# Patient Record
Sex: Female | Born: 1975 | Race: Black or African American | Hispanic: No | Marital: Single | Smoking: Former smoker
Health system: Southern US, Community
[De-identification: ages and names within clinical notes are randomized; demographics above are authoritative.]

## PROBLEM LIST (undated history)

## (undated) DIAGNOSIS — T7840XA Allergy, unspecified, initial encounter: Secondary | ICD-10-CM

## (undated) DIAGNOSIS — F419 Anxiety disorder, unspecified: Secondary | ICD-10-CM

## (undated) DIAGNOSIS — D649 Anemia, unspecified: Secondary | ICD-10-CM

## (undated) DIAGNOSIS — Z5189 Encounter for other specified aftercare: Secondary | ICD-10-CM

## (undated) HISTORY — PX: TUBAL LIGATION: SHX77

## (undated) HISTORY — DX: Anemia, unspecified: D64.9

## (undated) HISTORY — DX: Encounter for other specified aftercare: Z51.89

## (undated) HISTORY — DX: Anxiety disorder, unspecified: F41.9

## (undated) HISTORY — DX: Allergy, unspecified, initial encounter: T78.40XA

---

## 2000-07-13 HISTORY — PX: TUBAL LIGATION: SHX77

## 2010-07-10 ENCOUNTER — Telehealth (INDEPENDENT_AMBULATORY_CARE_PROVIDER_SITE_OTHER): Payer: Self-pay | Admitting: *Deleted

## 2010-07-10 ENCOUNTER — Encounter: Payer: Self-pay | Admitting: Cardiology

## 2010-07-16 DIAGNOSIS — K219 Gastro-esophageal reflux disease without esophagitis: Secondary | ICD-10-CM | POA: Insufficient documentation

## 2010-07-16 DIAGNOSIS — R079 Chest pain, unspecified: Secondary | ICD-10-CM | POA: Insufficient documentation

## 2010-07-16 DIAGNOSIS — R9431 Abnormal electrocardiogram [ECG] [EKG]: Secondary | ICD-10-CM | POA: Insufficient documentation

## 2010-07-17 ENCOUNTER — Other Ambulatory Visit: Payer: Self-pay | Admitting: Cardiology

## 2010-07-17 ENCOUNTER — Ambulatory Visit
Admission: RE | Admit: 2010-07-17 | Discharge: 2010-07-17 | Payer: Self-pay | Source: Home / Self Care | Attending: Cardiology | Admitting: Cardiology

## 2010-07-17 DIAGNOSIS — R002 Palpitations: Secondary | ICD-10-CM | POA: Insufficient documentation

## 2010-07-17 LAB — TSH: TSH: 2.07 u[IU]/mL (ref 0.35–5.50)

## 2010-07-21 ENCOUNTER — Ambulatory Visit: Admission: RE | Admit: 2010-07-21 | Discharge: 2010-07-21 | Payer: Self-pay | Source: Home / Self Care

## 2010-07-21 ENCOUNTER — Ambulatory Visit (HOSPITAL_COMMUNITY)
Admission: RE | Admit: 2010-07-21 | Discharge: 2010-07-21 | Payer: Self-pay | Source: Home / Self Care | Attending: Cardiology | Admitting: Cardiology

## 2010-07-21 ENCOUNTER — Encounter: Payer: Self-pay | Admitting: Cardiology

## 2010-07-21 DIAGNOSIS — R002 Palpitations: Secondary | ICD-10-CM

## 2010-08-14 NOTE — Progress Notes (Signed)
  LOV received from [facility] gave to Lela for pt appt Jan 5th w/ Teresa Pelton Mesiemore  July 10, 2010 11:40 AM

## 2010-08-14 NOTE — Progress Notes (Signed)
Summary: [facility] Office Visit Note   [facility] Office Visit Note   Imported By: Roderic Ovens 08/08/2010 16:11:59  _____________________________________________________________________  External Attachment:    Type:   Image     Comment:   External Document

## 2010-08-14 NOTE — Assessment & Plan Note (Signed)
Summary: np6/dx:abnormal ekg/lg   CC:  referal from Dr. Roseanne Reno abnormal ekg.  History of Present Illness: 35 year old female with no prior cardiac history or evaluation of chest pain and abnormal electrocardiogram. No prior cardiac history. The patient states that for the past month she has had intermittent palpitations. They are sudden in onset and described as racing. There is no associated shortness of breath or syncope. There is chest heaviness. These typically resolve spontaneously gradually over time. She also states she will have chest squeezing at other times other than when she has her palpitations. This can last up to 30 minutes at a time. It is not pleuritic, positional, exertional or related to food. There is no associated symptoms. It improves with changing her position and sitting up straight. Because of the above we are asked to further evaluate.  Preventive Screening-Counseling & Management  Alcohol-Tobacco     Smoking Status: quit  Current Medications (verified): 1)  Naproxen 500 Mg Tabs (Naproxen) .Marland Kitchen.. 1 Tab By Mouth Once Daily 2)  Multivitamins   Tabs (Multiple Vitamin) .Marland Kitchen.. 1 Tab By Mouth Once Daily 3)  Omeprazole 20 Mg Cpdr (Omeprazole) .Marland Kitchen.. 1  Tab By Mouth Once Daily 4)  Actavis .... As Directed Skin Cream  Past History:  Past Medical History: GERD Hyperlipidemia  Past Surgical History: Tubal ligation  Family History: Reviewed history and no changes required. No premature CAD No sudden death  Social History: Reviewed history and no changes required. Student  Single  Tobacco Use - Former.  Alcohol Use - yes Smoking Status:  quit  Review of Systems       no fevers or chills, productive cough, hemoptysis, dysphasia, odynophagia, melena, hematochezia, dysuria, hematuria, rash, seizure activity, orthopnea, PND, pedal edema, claudication. Remaining systems are negative.   Vital Signs:  Patient profile:   35 year old female Weight:      210  pounds Pulse rate:   78 / minute Resp:     12 per minute BP sitting:   120 / 80  (left arm)  Vitals Entered By: Kem Parkinson (July 17, 2010 12:12 PM)  Physical Exam  General:  Well developed/well nourished in NAD Skin warm/dry Patient not depressed No peripheral clubbing Back-normal HEENT-normal/normal eyelids Neck supple/normal carotid upstroke bilaterally; no bruits; no JVD; no thyromegaly chest - CTA/ normal expansion CV - RRR/normal S1 and S2; no murmurs, rubs or gallops;  PMI nondisplaced Abdomen -NT/ND, no HSM, no mass, + bowel sounds, no bruit 2+ femoral pulses, no bruits Ext-no edema, chords, 2+ DP Neuro-grossly nonfocal     EKG  Procedure date:  07/17/2010  Findings:      Normal sinus rhythm, axis normal. Nonspecific ST changes.  Impression & Recommendations:  Problem # 1:  CHEST PAIN (ICD-786.50) Symptoms atypical and not consistent with cardiac pain. No further ischemia evaluation.  Problem # 2:  PALPITATIONS (ICD-785.1) Etiology unclear. Check echocardiogram, TSH and CardioNet. Orders: Echocardiogram (Echo) Event (Event) TLB-TSH (Thyroid Stimulating Hormone) (84443-TSH)  Problem # 3:  GERD (ICD-530.81) Management per primary care. Her updated medication list for this problem includes:    Omeprazole 20 Mg Cpdr (Omeprazole) .Marland Kitchen... 1  tab by mouth once daily  Patient Instructions: 1)  Your physician recommends that you schedule a follow-up appointment in: 8 WEEKS 2)  Your physician has requested that you have an echocardiogram.  Echocardiography is a painless test that uses sound waves to create images of your heart. It provides your doctor with information about the size and shape  of your heart and how well your heart's chambers and valves are working.  This procedure takes approximately one hour. There are no restrictions for this procedure. 3)  Your physician has recommended that you wear an event monitor.  Event monitors are medical devices that  record the heart's electrical activity. Doctors most often use these monitors to diagnose arrhythmias. Arrhythmias are problems with the speed or rhythm of the heartbeat. The monitor is a small, portable device. You can wear one while you do your normal daily activities. This is usually used to diagnose what is causing palpitations/syncope (passing out).

## 2010-08-27 ENCOUNTER — Telehealth (INDEPENDENT_AMBULATORY_CARE_PROVIDER_SITE_OTHER): Payer: Self-pay | Admitting: *Deleted

## 2010-09-03 NOTE — Progress Notes (Signed)
  Phone Note Outgoing Call   Call placed by: Deliah Goody, RN,  August 27, 2010 3:08 PM Summary of Call: left message for pt that monitor shows NSR Deliah Goody, RN  August 27, 2010 3:09 PM

## 2010-09-08 ENCOUNTER — Ambulatory Visit (INDEPENDENT_AMBULATORY_CARE_PROVIDER_SITE_OTHER): Payer: Medicaid Other | Admitting: Cardiology

## 2010-09-08 ENCOUNTER — Encounter: Payer: Self-pay | Admitting: Cardiology

## 2010-09-08 DIAGNOSIS — R072 Precordial pain: Secondary | ICD-10-CM

## 2010-09-08 DIAGNOSIS — R002 Palpitations: Secondary | ICD-10-CM

## 2010-09-18 NOTE — Assessment & Plan Note (Signed)
Summary: 2 month [email or web address] appt confirm [email or web address]   CC:  follow up.  History of Present Illness: 35 year old female with no prior cardiac history I saw in Jan 2012 for evaluation of chest pain and abnormal electrocardiogram. Echocardiogram in January 2012 showed normal LV function. TSH was normal. Baseline CardioNet transmission showed sinus rhythm. Since I last saw her, the patient denies any dyspnea on exertion, orthopnea, PND, pedal edema, palpitations, syncope or chest pain.   Current Medications (verified): 1)  Naproxen 500 Mg Tabs (Naproxen) .Marland Kitchen.. 1 Tab By Mouth Once Daily 2)  Multivitamins   Tabs (Multiple Vitamin) .Marland Kitchen.. 1 Tab By Mouth Once Daily 3)  Omeprazole 20 Mg Cpdr (Omeprazole) .Marland Kitchen.. 1  Tab By Mouth Once Daily 4)  Actavis .... As Directed Skin Cream 5)  Hydrocodone-Acetaminophen 5-500 Mg Tabs (Hydrocodone-Acetaminophen) .... As Needed  Past History:  Past Medical History: Reviewed history from 07/17/2010 and no changes required. GERD Hyperlipidemia  Past Surgical History: Reviewed history from 07/17/2010 and no changes required. Tubal ligation  Social History: Reviewed history from 07/17/2010 and no changes required. Student  Single  Tobacco Use - Former.  Alcohol Use - yes  Review of Systems       no fevers or chills, productive cough, hemoptysis, dysphasia, odynophagia, melena, hematochezia, dysuria, hematuria, rash, seizure activity, orthopnea, PND, pedal edema, claudication. Remaining systems are negative.   Vital Signs:  Patient profile:   35 year old female Height:      65 inches Weight:      216 pounds BMI:     36.07 Pulse rate:   80 / minute Resp:     12 per minute BP sitting:   130 / 80  (left arm)  Vitals Entered By: Kem Parkinson (September 08, 2010 2:40 PM)  Physical Exam  General:  Well-developed well-nourished in no acute distress.  Skin is warm and dry.  HEENT is normal.  Neck is supple. No thyromegaly.  Chest is clear to  auscultation with normal expansion.  Cardiovascular exam is regular rate and rhythm.  Abdominal exam nontender or distended. No masses palpated. Extremities show no edema. neuro grossly intact    Impression & Recommendations:  Problem # 1:  PALPITATIONS (ICD-785.1) Symptoms resolved, TSH normal, echo normal. Will not pursue further workup at this point.  Problem # 2:  CHEST PAIN (ICD-786.50) Symptoms resolved. She feels it was most likely GI related. No further evaluation at this time.  Problem # 3:  GERD (ICD-530.81)  Her updated medication list for this problem includes:    Omeprazole 20 Mg Cpdr (Omeprazole) .Marland Kitchen... 1  tab by mouth once daily

## 2010-09-18 NOTE — Procedures (Signed)
Summary: Summary Report  Summary Report   Imported By: Erle Crocker 09/12/2010 13:40:16  _____________________________________________________________________  External Attachment:    Type:   Image     Comment:   External Document

## 2011-12-28 ENCOUNTER — Ambulatory Visit (INDEPENDENT_AMBULATORY_CARE_PROVIDER_SITE_OTHER): Payer: Medicaid Other | Admitting: Psychiatry

## 2011-12-28 DIAGNOSIS — F988 Other specified behavioral and emotional disorders with onset usually occurring in childhood and adolescence: Secondary | ICD-10-CM

## 2013-07-07 ENCOUNTER — Encounter (HOSPITAL_COMMUNITY): Payer: Self-pay | Admitting: Emergency Medicine

## 2013-07-07 ENCOUNTER — Emergency Department (HOSPITAL_COMMUNITY)
Admission: EM | Admit: 2013-07-07 | Discharge: 2013-07-07 | Disposition: A | Discharge status: Home / Self Care | Payer: Medicaid Other | Attending: Emergency Medicine | Admitting: Emergency Medicine

## 2013-07-07 DIAGNOSIS — Z3202 Encounter for pregnancy test, result negative: Secondary | ICD-10-CM | POA: Insufficient documentation

## 2013-07-07 DIAGNOSIS — N949 Unspecified condition associated with female genital organs and menstrual cycle: Secondary | ICD-10-CM | POA: Insufficient documentation

## 2013-07-07 DIAGNOSIS — N898 Other specified noninflammatory disorders of vagina: Secondary | ICD-10-CM | POA: Insufficient documentation

## 2013-07-07 DIAGNOSIS — A599 Trichomoniasis, unspecified: Secondary | ICD-10-CM | POA: Insufficient documentation

## 2013-07-07 DIAGNOSIS — R109 Unspecified abdominal pain: Secondary | ICD-10-CM | POA: Insufficient documentation

## 2013-07-07 DIAGNOSIS — Z87891 Personal history of nicotine dependence: Secondary | ICD-10-CM | POA: Insufficient documentation

## 2013-07-07 DIAGNOSIS — R300 Dysuria: Secondary | ICD-10-CM | POA: Insufficient documentation

## 2013-07-07 LAB — URINE MICROSCOPIC-ADD ON

## 2013-07-07 LAB — URINALYSIS, ROUTINE W REFLEX MICROSCOPIC
Bilirubin Urine: NEGATIVE
Glucose, UA: NEGATIVE mg/dL
Hgb urine dipstick: NEGATIVE
Ketones, ur: NEGATIVE mg/dL
Nitrite: NEGATIVE
Protein, ur: NEGATIVE mg/dL
Specific Gravity, Urine: 1.02 (ref 1.005–1.030)
Urobilinogen, UA: 1 mg/dL (ref 0.0–1.0)
pH: 7 (ref 5.0–8.0)

## 2013-07-07 LAB — POCT PREGNANCY, URINE: Preg Test, Ur: NEGATIVE

## 2013-07-07 LAB — WET PREP, GENITAL

## 2013-07-07 MED ORDER — LIDOCAINE HCL (PF) 1 % IJ SOLN
INTRAMUSCULAR | Status: AC
  Administered 2013-07-07: 0.9 mL
  Filled 2013-07-07: qty 5

## 2013-07-07 MED ORDER — METRONIDAZOLE 500 MG PO TABS
2000.0000 mg | ORAL_TABLET | Freq: Once | ORAL | Status: AC
  Administered 2013-07-07: 2000 mg via ORAL
  Filled 2013-07-07: qty 4

## 2013-07-07 MED ORDER — CEFTRIAXONE SODIUM 250 MG IJ SOLR
250.0000 mg | Freq: Once | INTRAMUSCULAR | Status: AC
  Administered 2013-07-07: 250 mg via INTRAMUSCULAR
  Filled 2013-07-07: qty 250

## 2013-07-07 MED ORDER — AZITHROMYCIN 250 MG PO TABS
1000.0000 mg | ORAL_TABLET | Freq: Once | ORAL | Status: AC
  Administered 2013-07-07: 1000 mg via ORAL
  Filled 2013-07-07: qty 4

## 2013-07-07 NOTE — ED Notes (Signed)
Per pt sts 2 weeks of lower abdominal pain with vaginal discharge. sts she thought was a yeast infection but not getting any better.

## 2013-07-07 NOTE — ED Notes (Signed)
OPt states she has been having some vaginal itching and lower abd pain for two weeks.

## 2013-07-07 NOTE — ED Provider Notes (Signed)
CSN: [id number]     Arrival date & time 07/07/13  1515 History   First MD Initiated Contact with Patient 07/07/13 2149     Chief Complaint  Patient presents with  . Abdominal Pain  . Vaginal Discharge   (Consider location/radiation/quality/duration/timing/severity/associated sxs/prior Treatment) Patient is a 37 y.o. female presenting with abdominal pain and vaginal discharge. The history is provided by the patient and medical records.  Abdominal Pain Associated symptoms: dysuria and vaginal discharge   Vaginal Discharge Associated symptoms: abdominal pain and dysuria    This is a 37 year old female status post tubal ligation presenting to the ED for pelvic pain, dysuria, vaginal discharge x 2 weeks.  Pt states pelvic pain is intermittent, described as a sharp, stabbing, sensation that seems to "jump around" her pelvis.  She has had no pain today.  Discharge is constant, white, and without associated odor.  No prior hx of STD.  Pt is in committed relationship with 1 female sexual partner-- states she is concerned he may have had an affair and exposed her to an STD.  Denies any fevers, sweats, or chills.  No nausea, vomiting, or diarrhea.  History reviewed. No pertinent past medical history. Past Surgical History  Procedure Laterality Date  . Tubal ligation     History reviewed. No pertinent family history. History  Substance Use Topics  . Smoking status: Former Games developer  . Smokeless tobacco: Not on file  . Alcohol Use: Yes   OB History   Grav Para Term Preterm Abortions TAB SAB Ect Mult Living                 Review of Systems  Gastrointestinal: Positive for abdominal pain.  Genitourinary: Positive for dysuria, vaginal discharge and pelvic pain.  All other systems reviewed and are negative.    Allergies  Review of patient's allergies indicates no known allergies.  Home Medications  No current outpatient prescriptions on file. BP 135/64  Pulse 70  Temp(Src) 98.3 F (36.8  C)  Resp 18  Ht 5\' 5"  (1.651 m)  Wt 228 lb (103.42 kg)  BMI 37.94 kg/m2  SpO2 99%  LMP 06/30/2013  Physical Exam  Nursing note and vitals reviewed. Constitutional: She is oriented to person, place, and time. She appears well-developed and well-nourished. No distress.  HENT:  Head: Normocephalic and atraumatic.  Mouth/Throat: Oropharynx is clear and moist.  Eyes: Conjunctivae and EOM are normal. Pupils are equal, round, and reactive to light.  Neck: Normal range of motion.  Cardiovascular: Normal rate, regular rhythm and normal heart sounds.   Pulmonary/Chest: Effort normal and breath sounds normal. No respiratory distress. She has no wheezes.  Abdominal: Soft. Bowel sounds are normal. There is no tenderness. There is no guarding.  Abdomen soft, non-distended, no peritoneal signs  Genitourinary: There is no tenderness or lesion on the right labia. There is no tenderness or lesion on the left labia. Cervix exhibits no motion tenderness. Right adnexum displays no tenderness. Left adnexum displays no tenderness. No tenderness or bleeding around the vagina. Vaginal discharge found.  Copious purulent vaginal discharge without odor; no bleeding noted; no adnexal or CMT  Musculoskeletal: Normal range of motion.  Neurological: She is alert and oriented to person, place, and time.  Skin: Skin is warm and dry. She is not diaphoretic.  Psychiatric: She has a normal mood and affect.    ED Course  Procedures (including critical care time) Labs Review Labs Reviewed  WET PREP, GENITAL - Abnormal; Notable for the following:  Trich, Wet Prep MODERATE (*)    Clue Cells Wet Prep HPF POC FEW (*)    WBC, Wet Prep HPF POC MODERATE (*)    All other components within normal limits  URINALYSIS, ROUTINE W REFLEX MICROSCOPIC - Abnormal; Notable for the following:    Leukocytes, UA SMALL (*)    All other components within normal limits  URINE MICROSCOPIC-ADD ON - Abnormal; Notable for the following:     Squamous Epithelial / LPF FEW (*)    All other components within normal limits  GC/CHLAMYDIA PROBE AMP  POCT PREGNANCY, URINE   Imaging Review No results found.  EKG Interpretation   None       MDM   1. Trichomoniasis    Abdomen is soft, non-distended without peritoneal signs-- low suspicion for acute/surgical pathology at this time.  U/a without signs of infection.  Wet prep with trich.  Gc/Chl pending.  Pt tx with rocephin, azithromycin, and flagyl in the ED.  Notified that she will be contacted in 48-72 hours if culture results are positive.  Strongly encouraged partner to be tested.  May FU with [place] [facility] dept if sx persist or if she desires further STD testing.  Discussed plan with pt, she acknowledged understanding and agreed.  Return precautions advised.  Garlon Hatchet, PA-C 07/08/13 0028

## 2013-07-08 NOTE — ED Provider Notes (Signed)
Medical screening examination/treatment/procedure(s) were performed by non-physician practitioner and as supervising physician I was immediately available for consultation/collaboration.  EKG Interpretation   None         Audree Camel, MD 07/08/13 [phone]

## 2013-07-10 LAB — GC/CHLAMYDIA PROBE AMP
CT Probe RNA: NEGATIVE
GC Probe RNA: NEGATIVE

## 2013-11-30 ENCOUNTER — Encounter: Payer: Self-pay | Admitting: Cardiology

## 2015-11-28 ENCOUNTER — Emergency Department (HOSPITAL_COMMUNITY)
Admission: EM | Admit: 2015-11-28 | Discharge: 2015-11-28 | Disposition: A | Discharge status: Home / Self Care | Payer: Medicaid Other | Attending: Emergency Medicine | Admitting: Emergency Medicine

## 2015-11-28 ENCOUNTER — Encounter (HOSPITAL_COMMUNITY): Payer: Self-pay | Admitting: Emergency Medicine

## 2015-11-28 DIAGNOSIS — Y9289 Other specified places as the place of occurrence of the external cause: Secondary | ICD-10-CM | POA: Insufficient documentation

## 2015-11-28 DIAGNOSIS — R5383 Other fatigue: Secondary | ICD-10-CM | POA: Insufficient documentation

## 2015-11-28 DIAGNOSIS — X500XXA Overexertion from strenuous movement or load, initial encounter: Secondary | ICD-10-CM | POA: Insufficient documentation

## 2015-11-28 DIAGNOSIS — Y9389 Activity, other specified: Secondary | ICD-10-CM | POA: Insufficient documentation

## 2015-11-28 DIAGNOSIS — Z87891 Personal history of nicotine dependence: Secondary | ICD-10-CM | POA: Insufficient documentation

## 2015-11-28 DIAGNOSIS — M25571 Pain in right ankle and joints of right foot: Secondary | ICD-10-CM | POA: Insufficient documentation

## 2015-11-28 DIAGNOSIS — Y998 Other external cause status: Secondary | ICD-10-CM | POA: Insufficient documentation

## 2015-11-28 DIAGNOSIS — S46811A Strain of other muscles, fascia and tendons at shoulder and upper arm level, right arm, initial encounter: Secondary | ICD-10-CM

## 2015-11-28 DIAGNOSIS — S29012A Strain of muscle and tendon of back wall of thorax, initial encounter: Secondary | ICD-10-CM | POA: Insufficient documentation

## 2015-11-28 LAB — BASIC METABOLIC PANEL
ANION GAP: 7 (ref 5–15)
BUN: 9 mg/dL (ref 6–20)
CHLORIDE: 109 mmol/L (ref 101–111)
CO2: 21 mmol/L — ABNORMAL LOW (ref 22–32)
Calcium: 8.6 mg/dL — ABNORMAL LOW (ref 8.9–10.3)
Creatinine, Ser: 0.86 mg/dL (ref 0.44–1.00)
Glucose, Bld: 101 mg/dL — ABNORMAL HIGH (ref 65–99)
POTASSIUM: 4.2 mmol/L (ref 3.5–5.1)
Sodium: 137 mmol/L (ref 135–145)

## 2015-11-28 LAB — CBC
HEMATOCRIT: 43.2 % (ref 36.0–46.0)
HEMOGLOBIN: 14.2 g/dL (ref 12.0–15.0)
MCH: 29.7 pg (ref 26.0–34.0)
MCHC: 32.9 g/dL (ref 30.0–36.0)
MCV: 90.4 fL (ref 78.0–100.0)
Platelets: 287 10*3/uL (ref 150–400)
RBC: 4.78 MIL/uL (ref 3.87–5.11)
RDW: 14.1 % (ref 11.5–15.5)
WBC: 7.2 10*3/uL (ref 4.0–10.5)

## 2015-11-28 MED ORDER — IBUPROFEN 600 MG PO TABS: 600.0000 mg | ORAL_TABLET | ORAL | Status: DC | PRN

## 2015-11-28 MED ORDER — IBUPROFEN 400 MG PO TABS
600.0000 mg | ORAL_TABLET | Freq: Once | ORAL | Status: AC
  Administered 2015-11-28: 17:00:00 600 mg via ORAL
  Filled 2015-11-28: qty 1

## 2015-11-28 NOTE — ED Provider Notes (Signed)
CSN: [id number]     Arrival date & time 11/28/15  1245 History   None    Chief Complaint  Patient presents with  . Fatigue  . Shoulder Pain     HPI Evelyn Joyce is a 40 y.o. F who pw right posterior shoulder pain at lateral side of back for 2 months.  Shoulder hurt more than normal yesterday, felt like spasm, saw it spasm and tighten.  Saw that her muscle tightened up and made it hard to move.  No injury she is aware of.  Moving boxes recently for a home move and thinks she may have injured her muscle.  Right shoulder hurts to move/ range but ranges fully. Right ankle pain for a couple weeks. Worse in AM, improves with use. No injury. No swelling.  Ambulated without significant pain.   Fatigued for years. Thinks her iron is low.  On iron supplements, OTC, assumed her iron was low wo testing. No VD/VB/urgency/dysuria. No exertional CP.  No cough/sputum.  No nvd. No fevers.   LMP 2 weeks ago.  Normal periods, not heavy or prolonged nor irregular. No hx of thyroid disease; possibly in family.  No chest pain. No SOB. No coughing, no hemoptysis.  No leg swelling.     History reviewed. No pertinent past medical history. Past Surgical History  Procedure Laterality Date  . Tubal ligation     No family history on file. Social History  Substance Use Topics  . Smoking status: Former Games developermoker  . Smokeless tobacco: None  . Alcohol Use: Yes   OB History    No data available     Review of Systems  Constitutional: Negative for fever, chills, activity change and appetite change.  Respiratory: Negative for cough, shortness of breath and wheezing.   Cardiovascular: Negative for chest pain and leg swelling.  Gastrointestinal: Negative for nausea, vomiting, abdominal pain and diarrhea.  Genitourinary: Negative for dysuria, frequency, flank pain, vaginal bleeding, vaginal discharge and vaginal pain.  Musculoskeletal: Positive for arthralgias. Negative for myalgias and back pain.  Skin: Negative for  rash and wound.  Neurological: Negative for syncope, facial asymmetry, weakness, light-headedness, numbness and headaches.  Psychiatric/Behavioral: Negative for confusion.  All other systems reviewed and are negative.     Allergies  Iodine  Home Medications   Prior to Admission medications   Medication Sig Start Date End Date Taking? Authorizing Provider  acetaminophen (TYLENOL) 500 MG tablet Take 1,000 mg by mouth every 6 (six) hours as needed for mild pain.   Yes Historical Provider, MD  ferrous sulfate 325 (65 FE) MG tablet Take 325 mg by mouth 2 (two) times a week.   Yes Historical Provider, MD  ibuprofen (ADVIL,MOTRIN) 600 MG tablet Take 1 tablet (600 mg total) by mouth every 4 (four) hours as needed for moderate pain. 11/28/15   Sofie RowerMike Betsie Peckman, MD   BP 132/65 mmHg  Pulse 79  Temp(Src) 98.3 F (36.8 C) (Oral)  Resp 18  SpO2 97%  LMP 11/15/2015 (Approximate) Physical Exam  Constitutional: She is oriented to person, place, and time. She appears well-developed and well-nourished. No distress.  HENT:  Head: Normocephalic and atraumatic.  Nose: Nose normal.  Eyes: Conjunctivae are normal.  No conj pallor  Neck: Normal range of motion. Neck supple. No tracheal deviation present.  Cardiovascular: Normal rate, regular rhythm and normal heart sounds.   No murmur heard. Strong radial pulses, equal  Pulmonary/Chest: Effort normal and breath sounds normal. No respiratory distress. She has no rales.  Abdominal: Soft. Bowel sounds are normal. She exhibits no distension and no mass. There is no tenderness.  Neg cvat, no hsm  Musculoskeletal: Normal range of motion. She exhibits tenderness (right trapezius reproduces pain). She exhibits no edema.  FROM of shoulder.  Some pain with external rotation.  Focally ttp to right trapezius, reproduces pain.  No bony ttp of any prominence on head to toe exam.    Neurological: She is alert and oriented to person, place, and time.  SENSORY:  sensation is intact to light touch in:  superficial radial nerve distribution (dorsal first web space) median nerve distribution (tip of index finger)   ulnar nerve distribution (tip of small finger)     MOTOR:  + motor posterior interosseous nerve (thumb IP extension) + anterior interosseous nerve (thumb IP flexion, index finger DIP flexion) + radial nerve (wrist extension) + median nerve (palpable firing thenar mass) + ulnar nerve (palpable firing of first dorsal interosseous muscle)  Skin: Skin is warm and dry. No rash noted.  Psychiatric: She has a normal mood and affect.  Nursing note and vitals reviewed.   ED Course  Procedures (including critical care time) Labs Review Labs Reviewed  BASIC METABOLIC PANEL - Abnormal; Notable for the following:    CO2 21 (*)    Glucose, Bld 101 (*)    Calcium 8.6 (*)    All other components within normal limits  CBC  URINALYSIS, ROUTINE W REFLEX MICROSCOPIC (NOT AT [facility])    Imaging Review No results found. I have personally reviewed and evaluated these images and lab results as part of my medical decision-making.   EKG Interpretation None      MDM   Final diagnoses:  Other fatigue  Strain of right trapezius muscle, initial encounter  Right ankle pain     VSS. Very well appearing. Pw chronic sx.  Right shoulder pain focally localized over right trapezius and was worsened by lifting heavy object. No CP at all on hx.  Chronic, not acute, no SOB, vitals normal, equal BS< no crepitus, doubt PTX.  PERC neg, no s./s DVT, doubt PE.  ACS not on differential given pain is muscular.  Ankle nontender, typical of arthritis given AM sx, stiff, obese.  XR not indicated.  Fatigue present for many months, labs unremarkable, not anemic;  Appears to have very high energy here, laughing jovial.  Will have her f/u with PCP for repeat exam and further nonemergent testing if indicated.  Counseled on early mobility and NSAID use.     Sofie Rower,  MD 11/28/15 1647  Cathren Laine, MD 11/28/15 [phone]

## 2015-11-28 NOTE — ED Notes (Signed)
Pt reports right shoulder pain, right ankle and fatigue. Pt alert x4. Pt denies injury. NAD at this time.

## 2015-11-28 NOTE — ED Notes (Signed)
Patient has not been triaged.   On the way to [facility], patient complained of chest pain.  Triage will triage patient, get blood and EKG.

## 2015-11-28 NOTE — ED Notes (Signed)
Please triage pt per fast track staff.  Pt not OT appropriate.

## 2017-10-28 ENCOUNTER — Emergency Department (HOSPITAL_COMMUNITY)
Admission: EM | Admit: 2017-10-28 | Discharge: 2017-10-29 | Disposition: A | Discharge status: Home / Self Care | Payer: Medicaid Other | Attending: Emergency Medicine | Admitting: Emergency Medicine

## 2017-10-28 ENCOUNTER — Emergency Department (HOSPITAL_COMMUNITY): Payer: Medicaid Other

## 2017-10-28 ENCOUNTER — Encounter (HOSPITAL_COMMUNITY): Payer: Self-pay

## 2017-10-28 DIAGNOSIS — Z87891 Personal history of nicotine dependence: Secondary | ICD-10-CM | POA: Insufficient documentation

## 2017-10-28 DIAGNOSIS — M25562 Pain in left knee: Secondary | ICD-10-CM | POA: Insufficient documentation

## 2017-10-28 NOTE — ED Provider Notes (Signed)
Patient placed in Quick Look pathway, seen and evaluated   Chief Complaint: Left knee pain  HPI:   Patient presents to ED for evaluation of left knee pain and swelling since waking up yesterday.  Cannot recall any inciting event that may have triggered the symptoms.  She has not taken any medications prior to arrival to help with symptoms.  No previous history of similar symptoms, history of gout or other inflammatory or infectious arthritis, prior fracture, dislocations or procedures in the area, history of DVT or PE, recent surgeries, recent prolonged travel or OCP use.  ROS: Left knee pain  Physical Exam:   Gen: No distress  Neuro: Awake and Alert  Skin: Warm    Focused Exam: Tenderness to palpation of the medial aspect of the left patella.  No significant lower extremity edema noted.  No erythema or warmth of joint.  No calf tenderness bilaterally.  2+ DP pulse noted.  Full active and passive range of motion of knee without difficulty.   Initiation of care has begun. The patient has been counseled on the process, plan, and necessity for staying for the completion/evaluation, and the remainder of the medical screening examination.    Dietrich PatesKhatri, Nayah Lukens, PA-C 10/28/17 2009    Raeford RazorKohut, Stephen, MD 10/30/17 1008

## 2017-10-28 NOTE — ED Triage Notes (Signed)
Pt presents with 2 month h/o generalized itching.  Pt using OTC creams w/o relief.  Pt also reports 2 day h/o swelling to both knees with L>R.  Pt reports pain to knee that radiates around to back; denies any injury.

## 2017-10-29 MED ORDER — NAPROXEN 500 MG PO TABS: 500.0000 mg | ORAL_TABLET | Freq: Two times a day (BID) | ORAL | 0 refills | Status: DC

## 2017-10-29 NOTE — Discharge Instructions (Signed)
Take the prescribed medication as directed. Can ice and elevate knee at home to help with pain/swelling. Follow-up with orthopedics if you have ongoing issues. Return to the ED for new or worsening symptoms.

## 2017-10-29 NOTE — ED Provider Notes (Signed)
[facility] Provider Note   CSN: [id number] Arrival date & time: 10/28/17  1921     History   Chief Complaint No chief complaint on file.   HPI Evelyn Joyce is a 42 y.o. female.  The history is provided by the patient and medical records.     42 year old female who with left knee pain.  Reports this began today, has been worsening throughout the day.  States her knee feels swollen and stiff.  She has pain with range of motion.  She denies any recent injury, trauma, or falls.  States she is on her feet for several hours during the day.  She has not tried any medications for her symptoms.  States she is able to walk, but feels she is limping.  History reviewed. No pertinent past medical history.  Patient Active Problem List   Diagnosis Date Noted  . PALPITATIONS 07/17/2010  . GERD 07/16/2010  . CHEST PAIN 07/16/2010  . ELECTROCARDIOGRAM, ABNORMAL 07/16/2010    Past Surgical History:  Procedure Laterality Date  . TUBAL LIGATION       OB History   None      Home Medications    Prior to Admission medications   Medication Sig Start Date End Date Taking? Authorizing Provider  acetaminophen (TYLENOL) 500 MG tablet Take 1,000 mg by mouth every 6 (six) hours as needed for mild pain.    [provider]  ferrous sulfate 325 (65 FE) MG tablet Take 325 mg by mouth 2 (two) times a week.    [provider]  ibuprofen (ADVIL,MOTRIN) 600 MG tablet Take 1 tablet (600 mg total) by mouth every 4 (four) hours as needed for moderate pain. 11/28/15   Sofie RowerStengel, Mike, MD    Family History History reviewed. No pertinent family history.  Social History Social History   Tobacco Use  . Smoking status: Former Games developermoker  . Smokeless tobacco: Never Used  Substance Use Topics  . Alcohol use: Yes  . Drug use: Not on file     Allergies   Iodine   Review of Systems Review of Systems  Musculoskeletal: Positive for arthralgias.    All other systems reviewed and are negative.    Physical Exam Updated Vital Signs BP (!) 119/46 (BP Location: Right Arm)   Pulse 62   Temp 98.6 F (37 C) (Oral)   Resp 18   Ht 5\' 5"  (1.651 m)   Wt 117.9 kg (260 lb)   LMP 10/25/2017 (Exact Date) Comment: pt shielded  SpO2 99%   BMI 43.27 kg/m   Physical Exam  Constitutional: She is oriented to person, place, and time. She appears well-developed and well-nourished.  obese  HENT:  Head: Normocephalic and atraumatic.  Mouth/Throat: Oropharynx is clear and moist.  Eyes: Pupils are equal, round, and reactive to light. Conjunctivae and EOM are normal.  Neck: Normal range of motion.  Cardiovascular: Normal rate, regular rhythm and normal heart sounds.  Pulmonary/Chest: Effort normal and breath sounds normal.  Abdominal: Soft. Bowel sounds are normal.  Musculoskeletal: Normal range of motion.  Left knee with some mild swelling diffusely, no palpable effusion; able to flex and extend knee normally but with some pain; calf non-tender, no asymmetry; DP pulses intact bilaterally  Neurological: She is alert and oriented to person, place, and time.  Skin: Skin is warm and dry.  Psychiatric: She has a normal mood and affect.  Nursing note and vitals reviewed.    ED Treatments / Results  Labs (all labs ordered are listed, but only abnormal results are displayed) Labs Reviewed - No data to display  EKG None  Radiology Dg Knee Complete 4 Views Left  Result Date: 10/28/2017 CLINICAL DATA:  Knee swelling EXAM: LEFT KNEE - COMPLETE 4+ VIEW COMPARISON:  None. FINDINGS: No fracture of the proximal tibia or distal femur. Patella is normal. No joint effusion. IMPRESSION: No fracture or dislocation. Electronically Signed   By: Genevive Bi M.D.   On: 10/28/2017 20:48    Procedures Procedures (including critical care time)  Medications Ordered in ED Medications - No data to display   Initial Impression / Assessment and Plan / ED  Course  I have reviewed the triage vital signs and the nursing notes.  Pertinent labs & imaging results that were available during my care of the patient were reviewed by me and considered in my medical decision making (see chart for details).  42 year old female who with atraumatic left knee pain that began today.  No intervention try prior to arrival.  Does appear to have some diffuse mild edema of the knee without palpable effusion.  Maintains full range of motion but does have some pain.  She is ambulatory.  X-ray without any acute findings.  Will place in the sleeve and start on anti-inflammatories.  We will have her follow-up with orthopedics if no improvement over the next week.  RICE routine at home.  Discussed plan with patient, she acknowledged understanding and agreed with plan of care.  Return precautions given for new or worsening symptoms.  Final Clinical Impressions(s) / ED Diagnoses   Final diagnoses:  Acute pain of left knee    ED Discharge Orders        Ordered    naproxen (NAPROSYN) 500 MG tablet  2 times daily with meals     10/29/17 0115       Garlon Hatchet, PA-C 10/29/17 1610    Geoffery Lyons, MD 10/29/17 [phone]

## 2017-11-30 ENCOUNTER — Other Ambulatory Visit: Payer: Self-pay

## 2017-11-30 ENCOUNTER — Encounter (HOSPITAL_COMMUNITY): Payer: Self-pay | Admitting: Emergency Medicine

## 2017-11-30 ENCOUNTER — Ambulatory Visit (HOSPITAL_COMMUNITY)
Admission: EM | Admit: 2017-11-30 | Discharge: 2017-11-30 | Disposition: A | Discharge status: Home / Self Care | Payer: Self-pay | Attending: Family Medicine | Admitting: Family Medicine

## 2017-11-30 DIAGNOSIS — M25562 Pain in left knee: Secondary | ICD-10-CM

## 2017-11-30 MED ORDER — PREDNISONE 20 MG PO TABS: 40.0000 mg | ORAL_TABLET | Freq: Every day | ORAL | 0 refills | Status: AC

## 2017-11-30 NOTE — ED Provider Notes (Signed)
[facility]    CSN: [id number] Arrival date & time: 11/30/17  1301     History   Chief Complaint Chief Complaint  Patient presents with  . Knee Pain    left    HPI Evelyn Joyce is a 41 y.o. female.   Evelyn Joyce presents with complaints of left knee pain which has been ongoing for the past month. Worse with activity. No specific known injury. She feels it may be worse. Worse with weight bearing or standing too long. 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 Denies numbness or tingling. Was seen in ER 4/18 without acute findings on xray. Does not have a PCP, has not followed with orthopedics. Has been taking naproxen twice a day which helps some. Has not been using a knee sleeve. Has some swelling at times. No previous knee injury or surgery. Hx of gerd, palpitations, tubal ligation.   ROS per HPI.      History reviewed. No pertinent past medical history.  Patient Active Problem List   Diagnosis Date Noted  . PALPITATIONS 07/17/2010  . GERD 07/16/2010  . CHEST PAIN 07/16/2010  . ELECTROCARDIOGRAM, ABNORMAL 07/16/2010    Past Surgical History:  Procedure Laterality Date  . TUBAL LIGATION      OB History   None      Home Medications    Prior to Admission medications   Medication Sig Start Date End Date Taking? Authorizing Provider  ibuprofen (ADVIL,MOTRIN) 600 MG tablet Take 1 tablet (600 mg total) by mouth every 4 (four) hours as needed for moderate pain. 11/28/15  Yes Sofie Rower, MD  acetaminophen (TYLENOL) 500 MG tablet Take 1,000 mg by mouth every 6 (six) hours as needed for mild pain.    [provider]  ferrous sulfate 325 (65 FE) MG tablet Take 325 mg by mouth 2 (two) times a week.    [provider]  naproxen (NAPROSYN) 500 MG tablet Take 1 tablet (500 mg total) by mouth 2 (two) times daily with a meal. 10/29/17   Allyne Gee, Rosezella [place], PA-C  predniSONE (DELTASONE) 20 MG tablet Take 2 tablets (40 mg  total) by mouth daily with breakfast for 5 days. 11/30/17 12/05/17  Georgetta Haber, NP    Family History History reviewed. No pertinent family history.  Social History Social History   Tobacco Use  . Smoking status: Former Games developer  . Smokeless tobacco: Never Used  Substance Use Topics  . Alcohol use: Yes  . Drug use: Not on file     Allergies   Iodine   Review of Systems Review of Systems   Physical Exam Triage Vital Signs ED Triage Vitals  Enc Vitals Group     BP 11/30/17 1400 (!) 120/53     Pulse Rate 11/30/17 1400 63     Resp --      Temp 11/30/17 1400 98.6 F (37 C)     Temp Source 11/30/17 1400 Oral     SpO2 11/30/17 1400 100 %     Weight --      Height --      Head Circumference --      Peak Flow --      Pain Score 11/30/17 1359 7     Pain Loc --      Pain Edu? --      Excl. in GC? --    No data found.  Updated Vital Signs BP (!) 120/53 (BP Location: Left Wrist)  Pulse 63   Temp 98.6 F (37 C) (Oral)   LMP 11/22/2017 (Approximate)   SpO2 100%   Physical Exam  Constitutional: She is oriented to person, place, and time. She appears well-developed and well-nourished. No distress.  Cardiovascular: Normal rate, regular rhythm and normal heart sounds.  Pulmonary/Chest: Effort normal and breath sounds normal.  Musculoskeletal:       Left knee: She exhibits decreased range of motion. She exhibits no swelling, no effusion, no ecchymosis, no deformity, no laceration, no erythema, normal alignment, no LCL laxity, no bony tenderness, normal meniscus and no MCL laxity. Tenderness found. Medial joint line and MCL tenderness noted.  Medial knee with tenderness and pain with stress; without lateral knee pain; mild pain with complete 180 extension and with flexion; mild posterior knee pain on palpation; without obvious swelling or effusion; strength equal bilaterally and sensation intact, strong pedal pulses  Neurological: She is alert and oriented to person,  place, and time.  Skin: Skin is warm and dry.     UC Treatments / Results  Labs (all labs ordered are listed, but only abnormal results are displayed) Labs Reviewed - No data to display  EKG None  Radiology No results found.  Procedures Procedures (including critical care time)  Medications Ordered in UC Medications - No data to display  Initial Impression / Assessment and Plan / UC Course  I have reviewed the triage vital signs and the nursing notes.  Pertinent labs & imaging results that were available during my care of the patient were reviewed by me and considered in my medical decision making (see chart for details).     Xray reviewed from 4/18. No new injury. Concern for strain to MCL vs meniscus. Ice, elevation, use of knee sleeve. 5 days of prednisone to further help with pain. Encouraged follow up with PCP and/or orthopedics for continued evaluation and treatment. Patient verbalized understanding and agreeable to plan. Ambulatory out of clinic without difficulty.      Final Clinical Impressions(s) / UC Diagnoses   Final diagnoses:  Left knee pain, unspecified chronicity     Discharge Instructions     Ice and elevate, especially after you have been on your feet more. Use of knee sleeve during activity to provide support and compression. 5 days of prednisone. See exercises provided. Please follow up with PCP and/or orthopedics for persistent symptoms.    ED Prescriptions    Medication Sig Dispense Auth. Provider   predniSONE (DELTASONE) 20 MG tablet Take 2 tablets (40 mg total) by mouth daily with breakfast for 5 days. 10 tablet Georgetta Haber, NP     Controlled Substance Prescriptions [place] Controlled Substance Registry consulted? Not Applicable   Georgetta Haber, NP 11/30/17 1448

## 2017-11-30 NOTE — ED Triage Notes (Signed)
Pt reports left knee pain that started over two weeks ago.  Pt states she was seen in the ED at that time and was given ibuprofen.  That helped a little, but not completely taking away the pain.

## 2017-11-30 NOTE — Discharge Instructions (Addendum)
Ice and elevate, especially after you have been on your feet more. Use of knee sleeve during activity to provide support and compression. 5 days of prednisone. See exercises provided. Please follow up with PCP and/or orthopedics for persistent symptoms.

## 2018-09-11 IMAGING — DX DG KNEE COMPLETE 4+V*L*
4 series · 4 of 4 positions shown · non-contrast
Comparison: None.

CLINICAL DATA: Knee swelling

EXAM:
LEFT KNEE - COMPLETE 4+ VIEW

[t knee ap left]
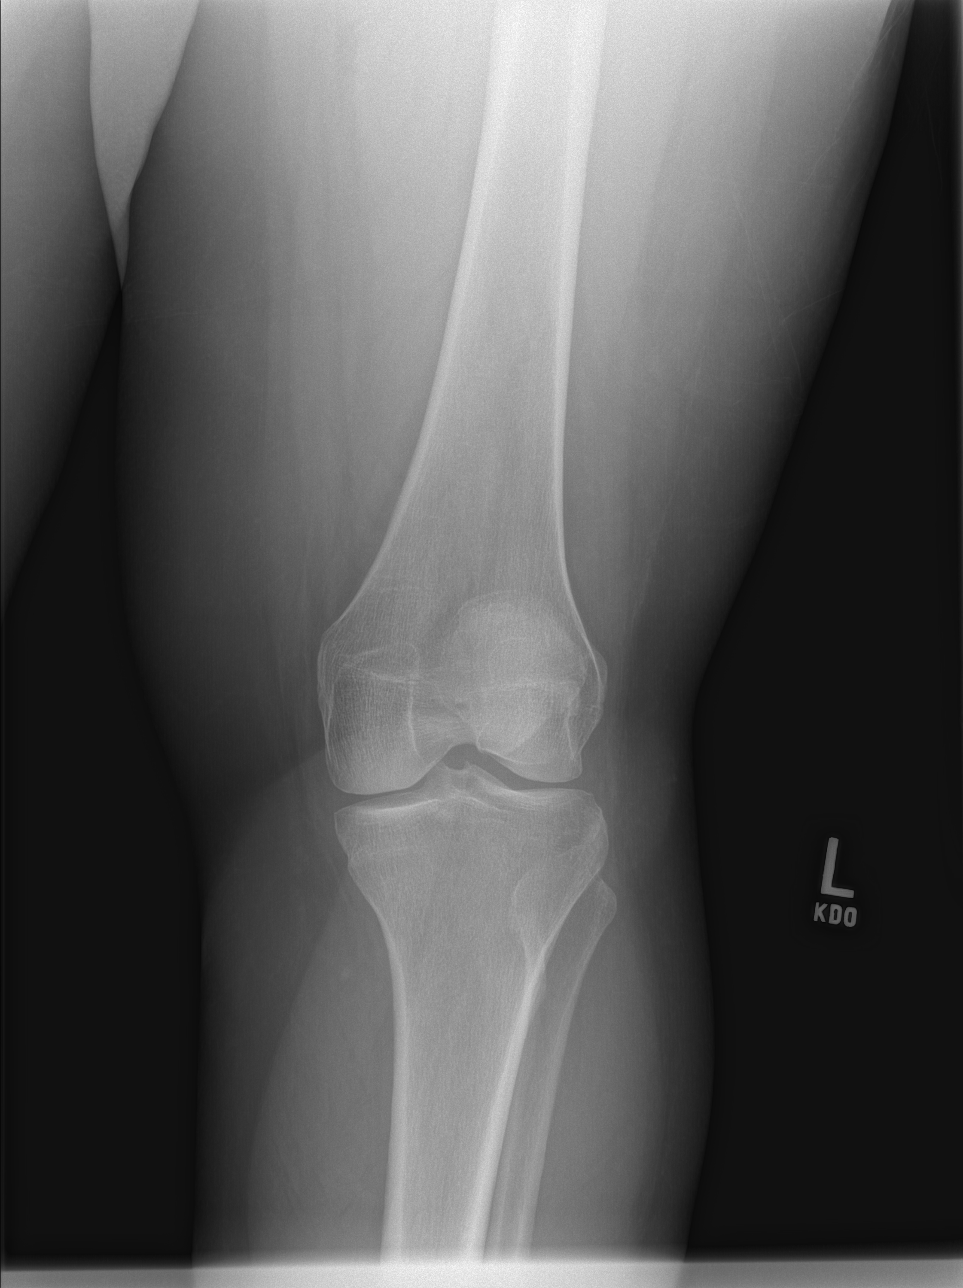

[t knee obl left (1 of 2)]
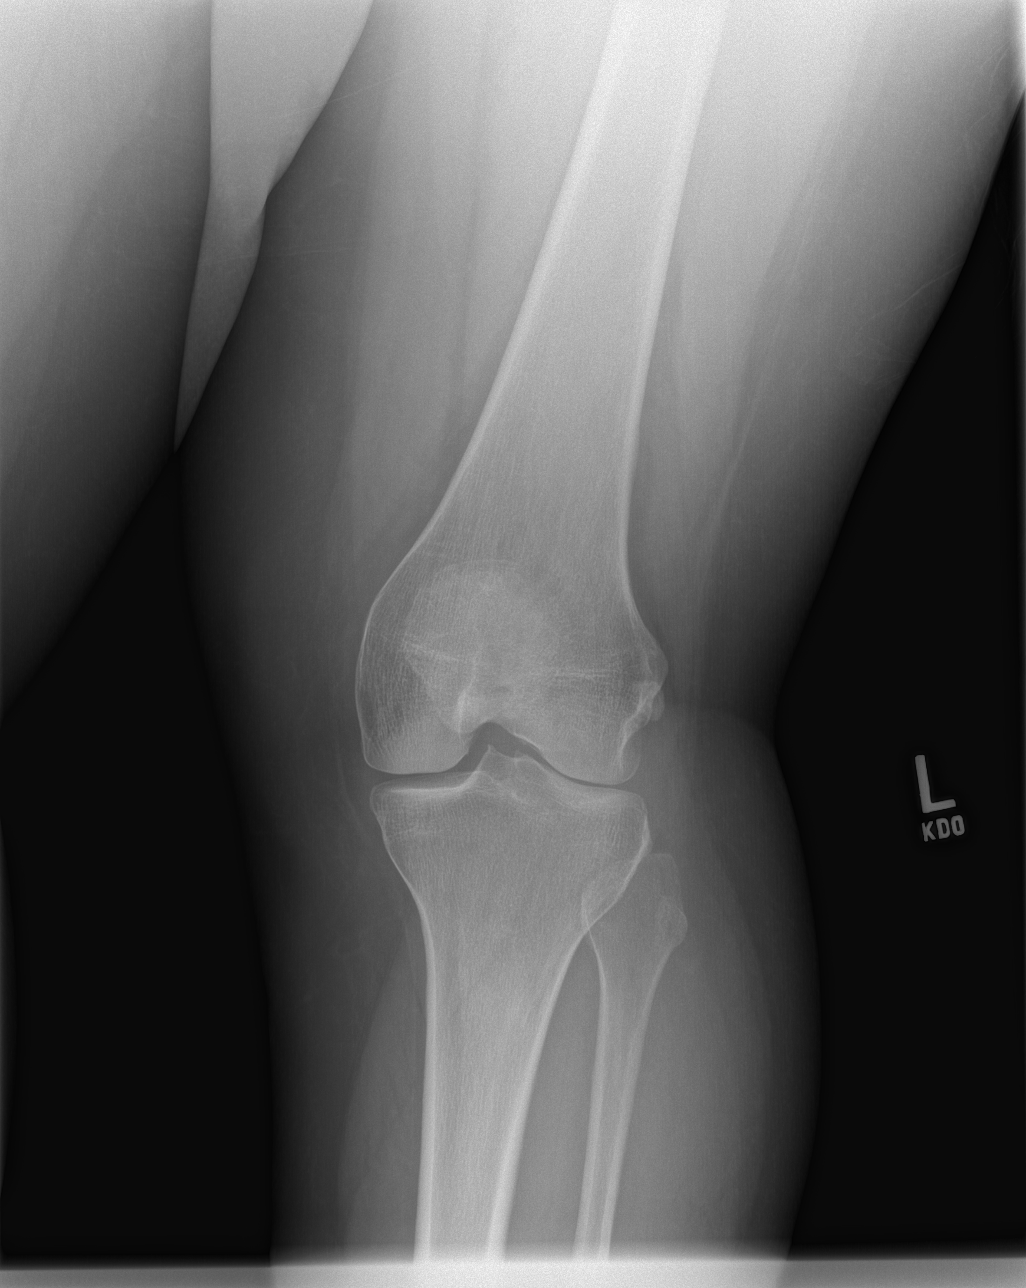

[t knee obl left (2 of 2)]
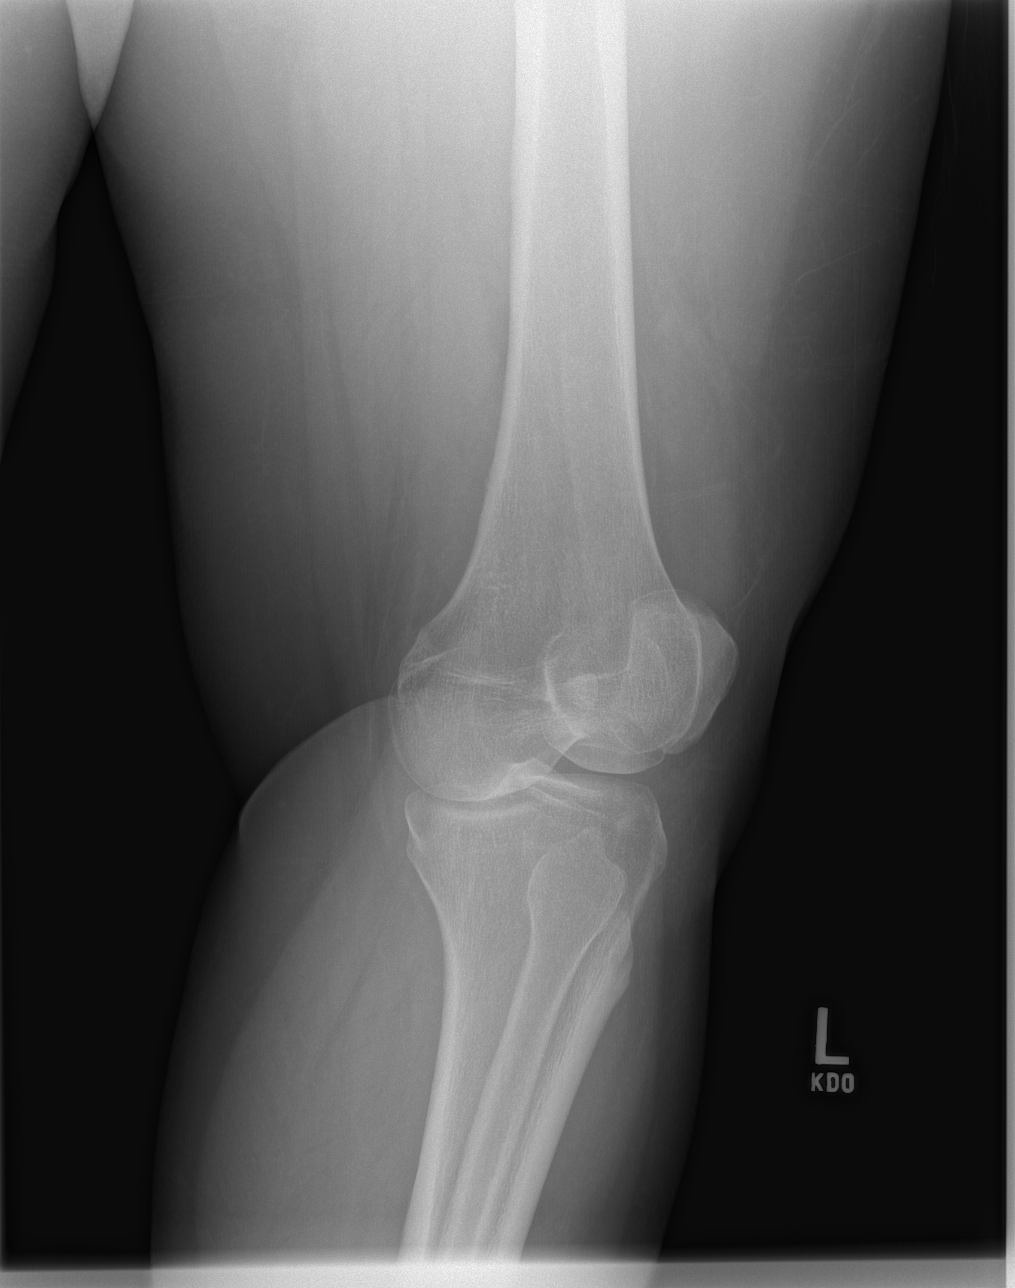

[x knee lat left]
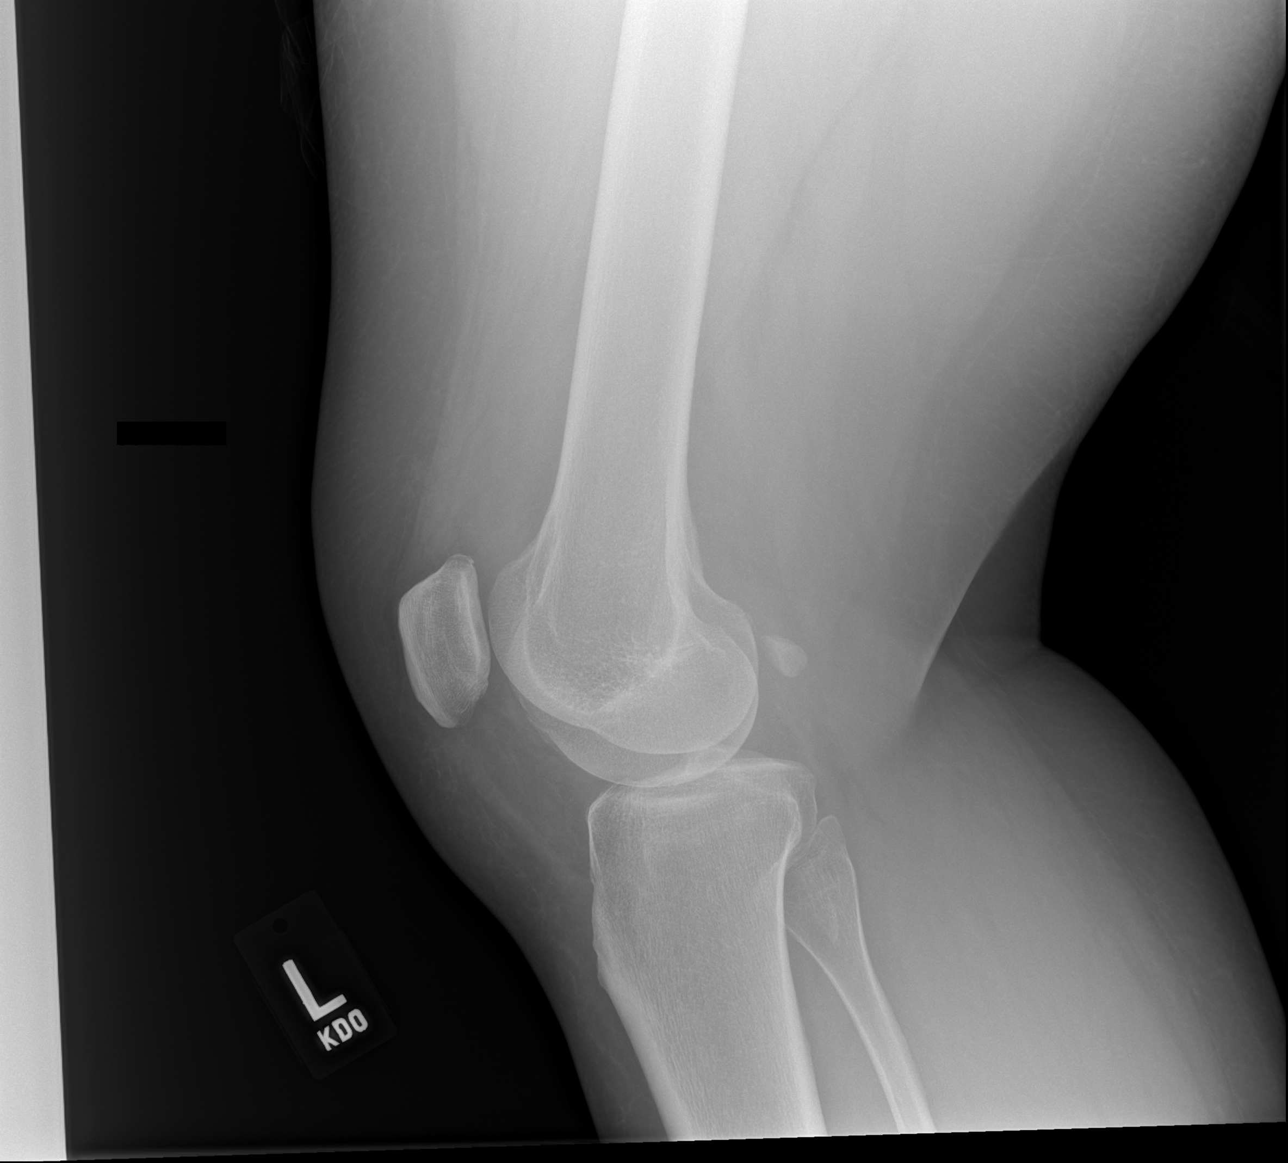

[4 of 4 positions shown; findings below may reference images not displayed]

FINDINGS: No fracture of the proximal tibia or distal femur. Patella is
normal. No joint effusion.
IMPRESSION: No fracture or dislocation.

## 2018-10-22 ENCOUNTER — Other Ambulatory Visit: Payer: Self-pay

## 2018-10-22 ENCOUNTER — Emergency Department (HOSPITAL_COMMUNITY): Payer: HRSA Program

## 2018-10-22 ENCOUNTER — Inpatient Hospital Stay (HOSPITAL_COMMUNITY)
Admission: EM | Admit: 2018-10-22 | Discharge: 2018-10-28 | DRG: 177 | Disposition: A | Discharge status: Home / Self Care | Payer: HRSA Program | Attending: Internal Medicine | Admitting: Internal Medicine

## 2018-10-22 DIAGNOSIS — E860 Dehydration: Secondary | ICD-10-CM | POA: Diagnosis present

## 2018-10-22 DIAGNOSIS — Z803 Family history of malignant neoplasm of breast: Secondary | ICD-10-CM | POA: Diagnosis not present

## 2018-10-22 DIAGNOSIS — K219 Gastro-esophageal reflux disease without esophagitis: Secondary | ICD-10-CM | POA: Diagnosis present

## 2018-10-22 DIAGNOSIS — J1282 Pneumonia due to coronavirus disease 2019: Secondary | ICD-10-CM | POA: Diagnosis present

## 2018-10-22 DIAGNOSIS — J9601 Acute respiratory failure with hypoxia: Secondary | ICD-10-CM | POA: Diagnosis present

## 2018-10-22 DIAGNOSIS — Z833 Family history of diabetes mellitus: Secondary | ICD-10-CM | POA: Diagnosis not present

## 2018-10-22 DIAGNOSIS — J1289 Other viral pneumonia: Secondary | ICD-10-CM | POA: Diagnosis present

## 2018-10-22 DIAGNOSIS — Z87891 Personal history of nicotine dependence: Secondary | ICD-10-CM | POA: Diagnosis not present

## 2018-10-22 DIAGNOSIS — N179 Acute kidney failure, unspecified: Secondary | ICD-10-CM | POA: Diagnosis present

## 2018-10-22 DIAGNOSIS — Z9851 Tubal ligation status: Secondary | ICD-10-CM | POA: Diagnosis not present

## 2018-10-22 DIAGNOSIS — Z888 Allergy status to other drugs, medicaments and biological substances status: Secondary | ICD-10-CM | POA: Diagnosis not present

## 2018-10-22 DIAGNOSIS — Z6841 Body Mass Index (BMI) 40.0 and over, adult: Secondary | ICD-10-CM | POA: Diagnosis not present

## 2018-10-22 DIAGNOSIS — Z20828 Contact with and (suspected) exposure to other viral communicable diseases: Secondary | ICD-10-CM | POA: Diagnosis not present

## 2018-10-22 DIAGNOSIS — J189 Pneumonia, unspecified organism: Secondary | ICD-10-CM | POA: Diagnosis not present

## 2018-10-22 LAB — COMPREHENSIVE METABOLIC PANEL
ALT: 20 U/L (ref 0–44)
AST: 32 U/L (ref 15–41)
Albumin: 3.4 g/dL — ABNORMAL LOW (ref 3.5–5.0)
Alkaline Phosphatase: 65 U/L (ref 38–126)
Anion gap: 12 (ref 5–15)
BUN: 7 mg/dL (ref 6–20)
CO2: 23 mmol/L (ref 22–32)
Calcium: 8.8 mg/dL — ABNORMAL LOW (ref 8.9–10.3)
Chloride: 102 mmol/L (ref 98–111)
Creatinine, Ser: 1.21 mg/dL — ABNORMAL HIGH (ref 0.44–1.00)
GFR calc Af Amer: >60 mL/min (ref >60)
GFR calc non Af Amer: 56 mL/min — ABNORMAL LOW (ref >60)
Glucose, Bld: 140 mg/dL — ABNORMAL HIGH (ref 70–99)
Potassium: 3.8 mmol/L (ref 3.5–5.1)
Sodium: 137 mmol/L (ref 135–145)
Total Bilirubin: 0.4 mg/dL (ref 0.3–1.2)
Total Protein: 7.1 g/dL (ref 6.5–8.1)

## 2018-10-22 LAB — CBC WITH DIFFERENTIAL/PLATELET
Abs Immature Granulocytes: 0.02 10*3/uL (ref 0.00–0.07)
Basophils Absolute: 0 10*3/uL (ref 0.0–0.1)
Basophils Relative: 0 %
Eosinophils Absolute: 0 10*3/uL (ref 0.0–0.5)
Eosinophils Relative: 0 %
HCT: 36 % (ref 36.0–46.0)
Hemoglobin: 11.4 g/dL — ABNORMAL LOW (ref 12.0–15.0)
Immature Granulocytes: 1 %
Lymphocytes Relative: 24 %
Lymphs Abs: 0.8 10*3/uL (ref 0.7–4.0)
MCH: 27.7 pg (ref 26.0–34.0)
MCHC: 31.7 g/dL (ref 30.0–36.0)
MCV: 87.4 fL (ref 80.0–100.0)
Monocytes Absolute: 0.2 10*3/uL (ref 0.1–1.0)
Monocytes Relative: 6 %
Neutro Abs: 2.5 10*3/uL (ref 1.7–7.7)
Neutrophils Relative %: 69 %
Platelets: 302 10*3/uL (ref 150–400)
RBC: 4.12 MIL/uL (ref 3.87–5.11)
RDW: 14.9 % (ref 11.5–15.5)
WBC: 3.6 10*3/uL — ABNORMAL LOW (ref 4.0–10.5)
nRBC: 0 % (ref 0.0–0.2)

## 2018-10-22 LAB — URINALYSIS, ROUTINE W REFLEX MICROSCOPIC
Bilirubin Urine: NEGATIVE
Glucose, UA: NEGATIVE mg/dL
Ketones, ur: NEGATIVE mg/dL
Nitrite: NEGATIVE
Protein, ur: 30 mg/dL — AB
Specific Gravity, Urine: 1.028 (ref 1.005–1.030)
pH: 5 (ref 5.0–8.0)

## 2018-10-22 LAB — LACTIC ACID, PLASMA: Lactic Acid, Venous: 1.5 mmol/L (ref 0.5–1.9)

## 2018-10-22 LAB — BRAIN NATRIURETIC PEPTIDE: B Natriuretic Peptide: 11.3 pg/mL (ref 0.0–100.0)

## 2018-10-22 LAB — TROPONIN I: Troponin I: <0.03 ng/mL (ref <0.03)

## 2018-10-22 MED ORDER — SODIUM CHLORIDE 0.9 % IV SOLN
1.0000 g | Freq: Once | INTRAVENOUS | Status: AC
  Administered 2018-10-22: 1 g via INTRAVENOUS
  Filled 2018-10-22: qty 10

## 2018-10-22 MED ORDER — ACETAMINOPHEN 325 MG PO TABS
650.0000 mg | ORAL_TABLET | Freq: Four times a day (QID) | ORAL | Status: DC | PRN
  Administered 2018-10-22 – 2018-10-23 (×4): 650 mg via ORAL
  Filled 2018-10-22 (×4): qty 2

## 2018-10-22 MED ORDER — ALBUTEROL SULFATE HFA 108 (90 BASE) MCG/ACT IN AERS
8.0000 | INHALATION_SPRAY | RESPIRATORY_TRACT | Status: DC
  Administered 2018-10-22 – 2018-10-23 (×3): 8 via RESPIRATORY_TRACT
  Filled 2018-10-22: qty 6.7

## 2018-10-22 MED ORDER — ALBUTEROL SULFATE HFA 108 (90 BASE) MCG/ACT IN AERS
8.0000 | INHALATION_SPRAY | Freq: Once | RESPIRATORY_TRACT | Status: AC
  Administered 2018-10-22: 18:00:00 8 via RESPIRATORY_TRACT
  Filled 2018-10-22: qty 6.7

## 2018-10-22 MED ORDER — SODIUM CHLORIDE 0.9 % IV BOLUS
1000.0000 mL | Freq: Once | INTRAVENOUS | Status: AC
  Administered 2018-10-22: 21:00:00 1000 mL via INTRAVENOUS

## 2018-10-22 MED ORDER — AZITHROMYCIN 250 MG PO TABS
500.0000 mg | ORAL_TABLET | Freq: Once | ORAL | Status: AC
  Administered 2018-10-22: 21:00:00 500 mg via ORAL
  Filled 2018-10-22: qty 2

## 2018-10-22 MED ORDER — ENOXAPARIN SODIUM 40 MG/0.4ML ~~LOC~~ SOLN: 40.0000 mg | SUBCUTANEOUS | Status: DC

## 2018-10-22 NOTE — ED Provider Notes (Signed)
[facility] EMERGENCY DEPARTMENT Provider Note   CSN: [id number] Arrival date & time: 10/22/18  1707    History   Chief Complaint Chief Complaint  Patient presents with  . Shortness of Breath    HPI Keonda Neidhardt is a 43 y.o. female remote history of tobacco use, GERD is here for evaluation of shortness of breath.  Onset 3 days ago, gradually worsening.  She is short of breath when she sitting down, talking, moving around.  Associated with constant chest tightness when taking deep breaths.  States she has to breathe faster to get enough air.  No frank chest pain with breathing or moving. Associated with forceful frequent dry cough for the last 3 days.  States she coughs so much she is having urinary incontinence.  Has also noticed some subjective chills, hot flashes.  Denies associated nasal congestion, sore throat, nausea, vomiting, abdominal pain, diarrhea.  No recent travel.  No exposure to suspected confirmed COVID-19.  Denies history of blood clots.  No recent prolonged travel, surgeries, hormonal use, hemoptysis, cancer history.  No history of heart disease or heart failure.  No orthopnea, PND, lower extremity edema or calf pain.     HPI  No past medical history on file.  Patient Active Problem List   Diagnosis Date Noted  . Acute respiratory failure with hypoxia (HCC) 10/22/2018  . PALPITATIONS 07/17/2010  . GERD 07/16/2010  . CHEST PAIN 07/16/2010  . ELECTROCARDIOGRAM, ABNORMAL 07/16/2010    Past Surgical History:  Procedure Laterality Date  . TUBAL LIGATION       OB History   No obstetric history on file.      Home Medications    Prior to Admission medications   Medication Sig Start Date End Date Taking? Authorizing Provider  acetaminophen (TYLENOL) 500 MG tablet Take 1,000 mg by mouth every 6 (six) hours as needed for mild pain.    [provider]  ferrous sulfate 325 (65 FE) MG tablet Take 325 mg by mouth 2 (two) times a week.     [provider]  ibuprofen (ADVIL,MOTRIN) 600 MG tablet Take 1 tablet (600 mg total) by mouth every 4 (four) hours as needed for moderate pain. 11/28/15   Sofie Rower, MD  naproxen (NAPROSYN) 500 MG tablet Take 1 tablet (500 mg total) by mouth 2 (two) times daily with a meal. 10/29/17   Garlon Hatchet, PA-C    Family History No family history on file.  Social History Social History   Tobacco Use  . Smoking status: Former Games developer  . Smokeless tobacco: Never Used  Substance Use Topics  . Alcohol use: Yes  . Drug use: Not on file     Allergies   Iodine   Review of Systems Review of Systems  Constitutional: Positive for chills and fever.  Respiratory: Positive for cough, chest tightness and shortness of breath.   All other systems reviewed and are negative.    Physical Exam Updated Vital Signs BP 113/66   Pulse 98   Temp (!) 100.8 F (38.2 C) (Rectal)   Resp (!) 22   Ht  (1.651 m)   Wt 118.8 kg   SpO2 100%   BMI 43.60 kg/m   Physical Exam Vitals signs and nursing note reviewed.  Constitutional:      General: She is in acute distress.     Appearance: She is well-developed.     Comments: Appears anxious, but nontoxic.  Alert.  Sitting up.  HENT:  Head: Normocephalic and atraumatic.     Nose: Nose normal.  Eyes:     Conjunctiva/sclera: Conjunctivae normal.  Neck:     Musculoskeletal: Normal range of motion.  Cardiovascular:     Rate and Rhythm: Normal rate and regular rhythm.     Comments: 1+ radial and DP pulses bilaterally.  No lower extremity edema.  No calf tenderness. Pulmonary:     Effort: Respiratory distress present.     Breath sounds: Examination of the right-lower field reveals decreased breath sounds. Examination of the left-lower field reveals decreased breath sounds. Decreased breath sounds present.     Comments: SPO2 92% on RA at rest in bed.  Patient ambulated in the room and maintained SPO2 greater than 90%, when she sat in bed  this dropped to 84%.  It took approximately 1 to 2 minutes to normalize to greater than 90%.  Noted tachypnea during this time, patient reported it shortness of breath.  Diminished lung sounds to lower lobes anteriorly and posteriorly, difficult exam due to body habitus.  Faint expiratory wheezing posteriorly to middle lobes.  No crackles. Abdominal:     General: Bowel sounds are normal.     Palpations: Abdomen is soft.     Tenderness: There is no abdominal tenderness.  Musculoskeletal: Normal range of motion.  Skin:    General: Skin is warm and dry.     Capillary Refill: Capillary refill takes less than 2 seconds.  Neurological:     Mental Status: She is alert.  Psychiatric:        Behavior: Behavior normal.      ED Treatments / Results  Labs (all labs ordered are listed, but only abnormal results are displayed) Labs Reviewed  CBC WITH DIFFERENTIAL/PLATELET - Abnormal; Notable for the following components:      Result Value   WBC 3.6 (*)    Hemoglobin 11.4 (*)    All other components within normal limits  COMPREHENSIVE METABOLIC PANEL - Abnormal; Notable for the following components:   Glucose, Bld 140 (*)    Creatinine, Ser 1.21 (*)    Calcium 8.8 (*)    Albumin 3.4 (*)    GFR calc non Af Amer 56 (*)    All other components within normal limits  URINALYSIS, ROUTINE W REFLEX MICROSCOPIC - Abnormal; Notable for the following components:   Color, Urine AMBER (*)    APPearance CLOUDY (*)    Hgb urine dipstick LARGE (*)    Protein, ur 30 (*)    Leukocytes,Ua MODERATE (*)    Bacteria, UA RARE (*)    All other components within normal limits  CULTURE, BLOOD (ROUTINE X 2)  CULTURE, BLOOD (ROUTINE X 2)  NOVEL CORONAVIRUS, NAA (HOSPITAL ORDER, SEND-OUT TO REF LAB)  LACTIC ACID, PLASMA  TROPONIN I  LACTIC ACID, PLASMA    EKG EKG Interpretation  Date/Time:  Saturday October 22 2018 18:03:52 EDT Ventricular Rate:  79 PR Interval:    QRS Duration: 92 QT Interval:  378 QTC  Calculation: 434 R Axis:   34 Text Interpretation:  Sinus rhythm Borderline T abnormalities, inferior leads Confirmed by Geoffery Lyons ([id number]) on 10/22/2018 7:14:30 PM   Radiology Dg Chest Port 1 View  Result Date: 10/22/2018 CLINICAL DATA:  Shortness of breath, cough, fever, and chest tightness for 3 days. EXAM: PORTABLE CHEST 1 VIEW COMPARISON:  None. FINDINGS: The cardiac silhouette is accentuated by portable AP technique and low lung volumes and is likely normal in size. Patchy airspace opacity is  present in the mid and lower lungs bilaterally. No Kerley lines, sizeable pleural effusion, or pneumothorax is identified. No acute osseous abnormality is seen. IMPRESSION: Bilateral airspace opacities most concerning for pneumonia with this history. Electronically Signed   By: Sebastian Ache M.D.   On: 10/22/2018 19:11    Procedures .Critical Care Performed by: Liberty Handy, PA-C Authorized by: Liberty Handy, PA-C   Critical care provider statement:    Critical care time (minutes):  45   Critical care was necessary to treat or prevent imminent or life-threatening deterioration of the following conditions:  Respiratory failure   Critical care was time spent personally by me on the following activities:  Discussions with consultants, evaluation of patient's response to treatment, examination of patient, ordering and performing treatments and interventions, ordering and review of laboratory studies, ordering and review of radiographic studies, pulse oximetry, re-evaluation of patient's condition, obtaining history from patient or surrogate and review of old charts   I assumed direction of critical care for this patient from another provider in my specialty: no     (including critical care time)  Medications Ordered in ED Medications  sodium chloride 0.9 % bolus 1,000 mL (has no administration in time range)  azithromycin (ZITHROMAX) tablet 500 mg (has no administration in time range)   cefTRIAXone (ROCEPHIN) 1 g in sodium chloride 0.9 % 100 mL IVPB (has no administration in time range)  albuterol (PROVENTIL HFA;VENTOLIN HFA) 108 (90 Base) MCG/ACT inhaler 8 puff (8 puffs Inhalation Given 10/22/18 1804)     Initial Impression / Assessment and Plan / ED Course  I have reviewed the triage vital signs and the nursing notes.  Pertinent labs & imaging results that were available during my care of the patient were reviewed by me and considered in my medical decision making (see chart for details).  Clinical Course as of Oct 22 1998  Sat Oct 22, 2018  1834 Temp(!): 100.8 F (38.2 C) [CG]  1835 Resp(!): 27 [CG]  1835 SpO2(!): 89 % [CG]  1838 TWI in III and slight in AVF  EKG 12-Lead [CG]  1909 Hgb urine dipstick(!): LARGE [CG]  1910 Glori Luis): MODERATE [CG]  1910 WBC, UA: 21-50 [CG]  1910 Bacteria, UA(!): RARE [CG]  1910 Creatinine(!): 1.21 [CG]  1915 IMPRESSION: Bilateral airspace opacities most concerning for pneumonia with this history.  DG Chest Port 1 View [CG]    Clinical Course User Index [CG] Liberty Handy, PA-C      ddx includes viral URI possibly COVID-19 vs PNA.  She had no GI symptoms. She has no GU symptoms. Her chest tightness has been constant > 3 days, given cough this is unlikely to be ACS.  Her clinical pictures is not typical of PE, her Well's score is 0 for PE.  I ambulated patient in room and she had exertional hypoxia to 84%, normalized after 1-2 mins of rest.  Will obtain labs, CXR, EKG, respiratory swabs, UA.   1915: Rectal temp 100.8 F. Tachypnea. Hypoxia. CXR with bilateral opacities.  UA suspicious for infection, pt denies urinary symptoms.  No leukocytosis. No lactic acidosis.  HD stable w/o hypotension but BP soft, will give 500 cc IVF given hypoxia and concern for COVID-19. Also she has no hypotension, lactic acidosis to warrant large boluses. Creatinine 1.21. Will consult hospitalist for admission for exertional dyspnea with  concern for COVID-19 and UTI.   Quilla Adema was evaluated in Emergency Department on 10/22/2018 for the symptoms described in the history  of present illness. She was evaluated in the context of the global COVID-19 pandemic, which necessitated consideration that the patient might be at risk for infection with the SARS-CoV-2 virus that causes COVID-19. Institutional protocols and algorithms that pertain to the evaluation of patients at risk for COVID-19 are in a state of rapid change based on information released by regulatory bodies including the CDC and federal and state organizations. These policies and algorithms were followed during the patient's care in the ED.  Final Clinical Impressions(s) / ED Diagnoses   Final diagnoses:  Acute respiratory failure with hypoxia [facility](HCC)    ED Discharge Orders    None       Jerrell MylarGibbons, Presley Gora J, PA-C 10/22/18 2001    Geoffery Lyonselo, [place], MD 10/22/18 2209

## 2018-10-22 NOTE — ED Notes (Signed)
ED TO INPATIENT HANDOFF REPORT  ED Nurse Name and Phone #: Johnnette GourdSarah [phone]  S Name/Age/Gender Evelyn Nedra HaiLee 43 y.o. female Room/Bed: 024C/024C  Code Status   Code Status: Not on file  Home/SNF/Other Home Patient oriented to: self, place, time and situation Is this baseline? Yes   Triage Complete: Triage complete  [facility]  Triage Note Pt reports shortness of breath, cough and some chest tightness for three days. Pt reports that she has had fevers with cold chills and feeling achy for the past three days as well. Pt reports that she does not have a thermometer at home to check her temperature. Pt is afebrile upon arrival to the ED. Pt is 92% on room air upon arrival to the ED.    Allergies Allergies  Allergen Reactions  . Iodine Itching and Rash    LOCALIZED REACTION    Level of Care/Admitting Diagnosis ED Disposition    ED Disposition Condition Comment   Admit  [facility] [[id number]]  Level of Care: Progressive [102]  Diagnosis: Acute respiratory failure with hypoxia (HCC) [161096][[id number]]  Admitting Physician: Silvio PateHOFFMAN, ERIK C [[id number]]  Attending Physician: Gust RungHOFFMAN, ERIK C [[id number]]  Estimated length of stay: 3 - 4 days  Certification:: I certify this patient will need inpatient services for at least 2 midnights  PT Class (Do Not Modify): Inpatient [101]  PT Acc Code (Do Not Modify): Private [1]       B Medical/Surgery History No past medical history on file. Past Surgical History:  Procedure Laterality Date  . TUBAL LIGATION       A IV Location/Drains/Wounds Patient Lines/Drains/Airways Status   Active Line/Drains/Airways    Name:   Placement date:   Placement time:   Site:   Days:   Peripheral IV 10/22/18 Left Antecubital   10/22/18    1740    Antecubital   less than 1          Intake/Output Last 24 hours No intake or output data in the 24 hours ending 10/22/18 2041  Labs/Imaging Results for orders placed or performed  during the hospital encounter of 10/22/18 (from the past 48 hour(s))  CBC with Differential     Status: Abnormal   Collection Time: 10/22/18  5:41 PM  Result Value Ref Range   WBC 3.6 (L) 4.0 - 10.5 K/uL   RBC 4.12 3.87 - 5.11 MIL/uL   Hemoglobin 11.4 (L) 12.0 - 15.0 g/dL   HCT 04.536.0 40.936.0 - 81.146.0 %   MCV 87.4 80.0 - 100.0 fL   MCH 27.7 26.0 - 34.0 pg   MCHC 31.7 30.0 - 36.0 g/dL   RDW 91.414.9 78.211.5 - 95.615.5 %   Platelets 302 150 - 400 K/uL   nRBC 0.0 0.0 - 0.2 %   Neutrophils Relative % 69 %   Neutro Abs 2.5 1.7 - 7.7 K/uL   Lymphocytes Relative 24 %   Lymphs Abs 0.8 0.7 - 4.0 K/uL   Monocytes Relative 6 %   Monocytes Absolute 0.2 0.1 - 1.0 K/uL   Eosinophils Relative 0 %   Eosinophils Absolute 0.0 0.0 - 0.5 K/uL   Basophils Relative 0 %   Basophils Absolute 0.0 0.0 - 0.1 K/uL   Immature Granulocytes 1 %   Abs Immature Granulocytes 0.02 0.00 - 0.07 K/uL    Comment: Performed at [facility], [address] [place], [place] [address]  Comprehensive metabolic panel     Status: Abnormal   Collection  Time: 10/22/18  5:41 PM  Result Value Ref Range   Sodium 137 135 - 145 mmol/L   Potassium 3.8 3.5 - 5.1 mmol/L   Chloride 102 98 - 111 mmol/L   CO2 23 22 - 32 mmol/L   Glucose, Bld 140 (H) 70 - 99 mg/dL   BUN 7 6 - 20 mg/dL   Creatinine, Ser 4.09 (H) 0.44 - 1.00 mg/dL   Calcium 8.8 (L) 8.9 - 10.3 mg/dL   Total Protein 7.1 6.5 - 8.1 g/dL   Albumin 3.4 (L) 3.5 - 5.0 g/dL   AST 32 15 - 41 U/L   ALT 20 0 - 44 U/L   Alkaline Phosphatase 65 38 - 126 U/L   Total Bilirubin 0.4 0.3 - 1.2 mg/dL   GFR calc non Af Amer 56 (L) >60 mL/min   GFR calc Af Amer >60 >60 mL/min   Anion gap 12 5 - 15    Comment: Performed at [facility], [address] [place], [place] [address]  Troponin I - Add-On to previous collection     Status: None   Collection Time: 10/22/18  5:41 PM  Result Value Ref Range   Troponin I <0.03 <0.03 ng/mL    Comment: Performed at [facility], [address], [place], [place] [address]  Lactic acid, plasma     Status: None   Collection Time: 10/22/18  5:56 PM  Result Value Ref Range   Lactic Acid, Venous 1.5 0.5 - 1.9 mmol/L    Comment: Performed at [facility], [address] [place], [place] [address]  Urinalysis, Routine w reflex microscopic     Status: Abnormal   Collection Time: 10/22/18  6:11 PM  Result Value Ref Range   Color, Urine AMBER (A) YELLOW    Comment: BIOCHEMICALS MAY BE AFFECTED BY COLOR   APPearance CLOUDY (A) CLEAR   Specific Gravity, Urine 1.028 1.005 - 1.030   pH 5.0 5.0 - 8.0   Glucose, UA NEGATIVE NEGATIVE mg/dL   Hgb urine dipstick LARGE (A) NEGATIVE   Bilirubin Urine NEGATIVE NEGATIVE   Ketones, ur NEGATIVE NEGATIVE mg/dL   Protein, ur 30 (A) NEGATIVE mg/dL   Nitrite NEGATIVE NEGATIVE   Leukocytes,Ua MODERATE (A) NEGATIVE   RBC / HPF 0-5 0 - 5 RBC/hpf   WBC, UA 21-50 0 - 5 WBC/hpf   Bacteria, UA RARE (A) NONE SEEN   Squamous Epithelial / LPF 0-5 0 - 5   Mucus PRESENT     Comment: Performed at [facility], [address], [place], [place] [address]   Dg Chest Port 1 View  Result Date: 10/22/2018 CLINICAL DATA:  Shortness of breath, cough, fever, and chest tightness for 3 days. EXAM: PORTABLE CHEST 1 VIEW COMPARISON:  None. FINDINGS: The cardiac silhouette is accentuated by portable AP technique and low lung volumes and is likely normal in size. Patchy airspace opacity is present in the mid and lower lungs bilaterally. No Kerley lines, sizeable pleural effusion, or pneumothorax is identified. No acute osseous abnormality is seen. IMPRESSION: Bilateral airspace opacities most concerning for pneumonia with this history. Electronically Signed   By: Sebastian Ache M.D.   On: 10/22/2018 19:11    Pending Labs Unresulted Labs (From admission, onward)    Start     Ordered   10/23/18 0500  CBC  Tomorrow morning,   R     10/22/18 2039   10/23/18 0500  Basic metabolic panel  Tomorrow morning,   R      10/22/18 2039   10/22/18 1757  Novel Coronavirus, NAA (hospital order; send-out to ref lab)  (Novel Coronavirus, NAA [facility] Order; send-out to ref lab) with precautions panel)  Once,   R    Question Answer Comment  Current symptoms Fever and Shortness of breath   Excluded other viral illnesses No (testing not indicated)   Patient immune status Normal      10/22/18 1756   10/22/18 1756  Lactic acid, plasma  Now then every 2 hours,   STAT     10/22/18 1755   10/22/18 1755  Blood culture (routine x 2)  BLOOD CULTURE X 2,   STAT     10/22/18 1754          Vitals/Pain Today's Vitals   10/22/18 1945 10/22/18 2000 10/22/18 2015 10/22/18 2030  BP: (!) 107/56 124/64  115/64  Pulse: 89 (!) 102 99 93  Resp: 14 (!) 21  15  Temp:      TempSrc:      SpO2: 95% 97% 94%   Weight:      Height:      PainSc:        Isolation Precautions Droplet and Contact precautions  Medications Medications  sodium chloride 0.9 % bolus 1,000 mL (has no administration in time range)  azithromycin (ZITHROMAX) tablet 500 mg (has no administration in time range)  cefTRIAXone (ROCEPHIN) 1 g in sodium chloride 0.9 % 100 mL IVPB (has no administration in time range)  albuterol (PROVENTIL HFA;VENTOLIN HFA) 108 (90 Base) MCG/ACT inhaler 8 puff (has no administration in time range)  albuterol (PROVENTIL HFA;VENTOLIN HFA) 108 (90 Base) MCG/ACT inhaler 8 puff (8 puffs Inhalation Given 10/22/18 1804)    Mobility walks Low fall risk   Focused Assessments Cardiac Assessment Handoff:  Cardiac Rhythm: Normal sinus rhythm Lab Results  Component Value Date   TROPONINI <0.03 10/22/2018   No results found for: DDIMER Does the Patient currently have chest pain? No  , Pulmonary Assessment Handoff:  Lung sounds:            R Recommendations: See Admitting Provider Note  Report given to:   Additional Notes:

## 2018-10-22 NOTE — ED Triage Notes (Signed)
Pt reports shortness of breath, cough and some chest tightness for three days. Pt reports that she has had fevers with cold chills and feeling achy for the past three days as well. Pt reports that she does not have a thermometer at home to check her temperature. Pt is afebrile upon arrival to the ED. Pt is 92% on room air upon arrival to the ED.

## 2018-10-22 NOTE — ED Notes (Signed)
Per phelbotomy, patient refused her second blood culture.

## 2018-10-22 NOTE — ED Notes (Signed)
Pt SpO2 decreased to 86% on room air. PA-C present at bedside. Pt placed on 2L via [place].

## 2018-10-22 NOTE — H&P (Addendum)
Date: 10/22/2018               Patient Name:  Evelyn Joyce MRN: [id number]  DOB: Jul 31, 1975 Age / Sex: 43 y.o., female   PCP: Patient, No Pcp Per         Medical Service: Internal Medicine Teaching Service         Attending Physician: Dr. Gust RungHoffman, Erik C, DO    First Contact: Dr. Hermine MessickKrienke, Marissa Pager: [phone]  Second Contact: Dr. Lorenso Courierhundi, Vahini Pager: [phone]       After Hours (After 5p/  First Contact Pager: [phone]  weekends / holidays): Second Contact Pager: [phone]   Chief Complaint: Shortness of breath  History of Present Illness:  Evelyn Joyce is a 43 yo F w/ PMH of GERD presenting with shortness of breath. She was in her usual state of health until 3 days ago when she began to experience sudden onset chest tightness w/o radiation or pain with subjective fevers and shortness of breath while watching TV. She states she never had such symptoms in the past and she took some OTC tylenol and goody powder with minimal relief. She states over the last 3 days she had been intermittently febrile with constant chest tightness and frequent dry cough. She has been having progressive shortness of breath as well. She denies any travel history or sick contact. States she lives with her 42 yo son who does not have any symptoms and have been practicing social distancing. Her symptoms progressively worsened and she came to the ED for evaluation.  In the ED she was found to have low-grade fever (100.8) with desaturation event while ambulation (96->85) with new oxygen requirement. She was also found to have bilateral opacities in chest X-ray concerning for pneumonia. COVID-19 testing sample was collected and she was started on ceftriaxone and azithromycin.  Meds:    No outpatient medications have been marked as taking for the 10/22/18 encounter [facility](Hospital Encounter).   Allergies: Allergies as of 10/22/2018 - Review Complete 10/22/2018  Allergen Reaction Noted  . Iodine Itching and Rash  11/28/2015   No past medical history on file.  Family History:  Father has diabetes Mother had breast cancer in remission  Social History:  Lives at home with 42 yo son. 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 Denies any tobacco use, social drinker, illicit substance use.  Social History   Tobacco Use  . Smoking status: Former Games developermoker  . Smokeless tobacco: Never Used  Substance Use Topics  . Alcohol use: Yes  . Drug use: Not on file   Review of Systems: A complete ROS was negative except as per HPI.  Physical Exam: Blood pressure 116/72, pulse 93, temperature (!) 100.8 F (38.2 C), temperature source Rectal, resp. rate 15, height 5\' 5"  (1.651 m), weight 118.8 kg, SpO2 92 %. Gen: Well-developed, obese, NAD Neck: supple, ROM intact CV: RRR, S1, S2 normal, No rubs, no murmurs, no gallops Pulm: Diminished breath sounds in all lung fields, Difficulty with inspiration, no wheeze, no rales. - accessory muscle use. Extm: ROM intact, Peripheral pulses intact, No peripheral edema, no lower extremity tenderness or asymmetry or warmth Skin: Dry, Warm, normal turgor, no rashes, lesions, wounds.  Neuro: AAOx3 Psych: Normal mood and affect  EKG: personally reviewed my interpretation is normal sinus, normal axis, T-wave inversions on lead III & V3. Appear present on prior EKG.  CXR: personally reviewed my interpretation is bilateral hazy opacities lower lobes. No pneumothorax,  no pleural effusion.  Assessment & Plan by Problem: Active Problems:   Acute respiratory failure with hypoxia (HCC)  Evelyn Joyce is a 43 yo F w/ no significant PMH presenting with acute onset hypoxic respiratory distress. Unclear etiology but acuity of onset and no prior health condition concerning for viral etiology with COVID-19 part of the differential as she works in delivery. While she is fairly young, atypical community acquired pneumonia is possible. Could also be exacerbation of  undiagnosed asthma or COPD. Currently stable on 2L supplemental oxygen. Will require inpatient admission for oxygen requirement.   Acute hypoxic respiratory failure 2/2 viral vs bacterial pneumonia 91% on 2L supplemental oxygen. Temp of ~101 in ED. Chest X-ray show bilateral opacities. Appear dyspneic on exam. Desaturation to mid to low 80s during exertion. No prior hx of respiratory disease. Wells score 0 for PE. No obvious lower extremity swelling. WBC 3.6. Started on azithromycin and ceftriaxone in ED. - C/w azithromycin 500mg , ceftriaxone 1g daily - F/u COVID-19, influenza panel, BNP - Trend CBC - Albuetrol q4hr PRN for shortness of breath - F/u blood culture  AKI 2/2 dehydration Baseline creatinine 0.86. Admit creatinine 1.21. Most likely pre-renal 2/2 incraesed fluid requirement in setting of infection. Given 1L ns bolus in ED. - Trend BMP - Encourage oral hydration  DVT prophx: Lovenox Diet: Regular Code: Full  Dispo: Admit patient to Inpatient with expected length of stay greater than 2 midnights.  Signed: Theotis Barrio, MD 10/22/2018, 9:01 PM  Pager: [phone]

## 2018-10-23 DIAGNOSIS — Z9851 Tubal ligation status: Secondary | ICD-10-CM

## 2018-10-23 DIAGNOSIS — J1282 Pneumonia due to coronavirus disease 2019: Secondary | ICD-10-CM | POA: Diagnosis present

## 2018-10-23 DIAGNOSIS — N179 Acute kidney failure, unspecified: Secondary | ICD-10-CM | POA: Diagnosis present

## 2018-10-23 DIAGNOSIS — J189 Pneumonia, unspecified organism: Secondary | ICD-10-CM

## 2018-10-23 DIAGNOSIS — Z20828 Contact with and (suspected) exposure to other viral communicable diseases: Secondary | ICD-10-CM

## 2018-10-23 DIAGNOSIS — J1289 Other viral pneumonia: Secondary | ICD-10-CM

## 2018-10-23 LAB — INFLUENZA PANEL BY PCR (TYPE A & B)
Influenza A By PCR: NEGATIVE
Influenza B By PCR: NEGATIVE

## 2018-10-23 MED ORDER — ENOXAPARIN SODIUM 40 MG/0.4ML ~~LOC~~ SOLN
40.0000 mg | SUBCUTANEOUS | Status: DC
  Filled 2018-10-23: qty 0.4

## 2018-10-23 MED ORDER — ALBUTEROL SULFATE HFA 108 (90 BASE) MCG/ACT IN AERS
8.0000 | INHALATION_SPRAY | Freq: Three times a day (TID) | RESPIRATORY_TRACT | Status: DC
  Administered 2018-10-23: 8 via RESPIRATORY_TRACT
  Filled 2018-10-23: qty 6.7

## 2018-10-23 MED ORDER — ACETAMINOPHEN 325 MG PO TABS
650.0000 mg | ORAL_TABLET | ORAL | Status: DC | PRN
  Administered 2018-10-23 – 2018-10-27 (×8): 650 mg via ORAL
  Filled 2018-10-23 (×10): qty 2

## 2018-10-23 MED ORDER — AZITHROMYCIN 250 MG PO TABS
250.0000 mg | ORAL_TABLET | ORAL | Status: DC
  Administered 2018-10-23: 250 mg via ORAL
  Filled 2018-10-23: qty 1

## 2018-10-23 MED ORDER — ALBUTEROL SULFATE HFA 108 (90 BASE) MCG/ACT IN AERS
2.0000 | INHALATION_SPRAY | RESPIRATORY_TRACT | Status: DC | PRN
  Filled 2018-10-23: qty 6.7

## 2018-10-23 MED ORDER — SODIUM CHLORIDE 0.9 % IV SOLN
1.0000 g | INTRAVENOUS | Status: DC
  Administered 2018-10-23: 13:00:00 1 g via INTRAVENOUS
  Filled 2018-10-23 (×2): qty 10

## 2018-10-23 MED ORDER — SODIUM CHLORIDE 0.9 % IV SOLN
INTRAVENOUS | Status: DC | PRN
  Administered 2018-10-23: 250 mL via INTRAVENOUS

## 2018-10-23 MED ORDER — HYDROXYCHLOROQUINE SULFATE 200 MG PO TABS
400.0000 mg | ORAL_TABLET | Freq: Two times a day (BID) | ORAL | Status: AC
  Administered 2018-10-23 – 2018-10-24 (×2): 400 mg via ORAL
  Filled 2018-10-23 (×2): qty 2

## 2018-10-23 MED ORDER — HYDROXYCHLOROQUINE SULFATE 200 MG PO TABS
200.0000 mg | ORAL_TABLET | Freq: Two times a day (BID) | ORAL | Status: AC
  Administered 2018-10-24 – 2018-10-28 (×8): 200 mg via ORAL
  Filled 2018-10-23 (×9): qty 1

## 2018-10-23 NOTE — Progress Notes (Signed)
Called daughter of pt at [phone] for pt and let them talk.  Family updated

## 2018-10-23 NOTE — Progress Notes (Signed)
Pt resting with eyes closed. No acute distress noted.  Flu panel (-).  Will cont to monitor pt closely.

## 2018-10-23 NOTE — Progress Notes (Addendum)
Paged by nursing staff about patient refusing morning labs (cbc, bmp, trop). Evaluated pt at bedside. Currently endorsing chest tightness and malaise but not in acute respiratory distress. States she has 'no more blood to give' Nursing Staff informed me that she also refused second blood culture draw last night. She states discomfort with needle stick for lab draw and states she had plenty of blood drawn during admission. Discussed importance of monitoring troponin, renal function, neutropenia and explained we would also be monitoring hemoglobin to ensure she has adequate blood supply. She expressed understanding but clearly states she does not want further blood draws. Discussed again about risks of harm and inadequate treatment without lab results to guide treatment options. Evelyn Joyce again expressed understanding and states at this time she would not like any further blood draws.

## 2018-10-23 NOTE — Progress Notes (Signed)
  Date: 10/23/2018  Patient name: Evelyn Joyce  Medical record number: [id number]  Date of birth: 1975-10-19  Subjective: no complaints, feels slightly better this morning.  Objective:  Vital signs in last 24 hours: Vitals:   10/23/18 0757 10/23/18 0952 10/23/18 1135 10/23/18 1145  BP:  (!) 102/52 100/61   Pulse:  76 81   Resp:  18 18   Temp:   97.6 F (36.4 C)   TempSrc:   Oral   SpO2: 93% 96% 96% 94%  Weight:      Height:      General: resting in bed no distress on 3.5L via [place] Cardiac: RRR, no rubs, murmurs or gallops Pulm: clear to auscultation bilaterally, moving normal volumes of air Abd: soft, nontender, nondistended, BS present Ext: warm and well perfused  EKG NSR, T wave inversion in lead III, QTc 434 Assessment/Plan:   Acute respiratory failure with hypoxia (HCC) 2/2 Pneumonia - high clinical suspicion for COVID-19- testing pending, if negative would consider Chest CT or ID consult before removing precautions. - Continue Ceftriaxone 1g daily and Azithromycin 250mg  daily x4 day for CAP if Sars COV-2 (COVID-19) positive may discontinue and change to Hydroxychloroquine with QTc monitoring. - Supplemental O2 as needed.  AKI - received IVF, tolerating orals well, repeat testing tomorrow.  Confirmed patient has history of tubal ligation for contraception.  Dispo: Anticipated discharge in approximately 3 day(s).   Gust Rung, DO 10/23/2018, 12:02 PM

## 2018-10-23 NOTE — Progress Notes (Addendum)
CRITICAL VALUE ALERT  Critical Value:  COVID 19 POSITIVE  Date & Time Notied:  22:28 10/23/18  Provider Notified: Dr. Nedra Hai, Internal Med on call  Orders Received/Actions taken: Called IM MD on call.  Continue Droplet and Contact Precautions according to criteria.   Increased O2 [place] to 4L per SpO2 on 3L 92%.  SpO2 on 4L 95%

## 2018-10-23 NOTE — Progress Notes (Signed)
I have informed patient about the importance of lovenox injections and the risks associated with not taking them daily. The patient states that she does not want lovenox shots and does not wish to expand as to why.   -I requested the nurse to please continue to educate and offer the lovenox shots daily  -I have placed order for scds in meantime.   Lorenso Courier, MD Internal Medicine PGY2 Pager:[phone] 10/23/2018, 2:19 PM

## 2018-10-23 NOTE — Progress Notes (Signed)
Patient continues to refuse lovenox SQ injections, Dr. Karilyn Cota notified.

## 2018-10-23 NOTE — Progress Notes (Addendum)
Messaged Internal Med Resident on call :6E 18 oral Temp 102.1 after taking Tylenol at 4:30PM please assist.  Tylenol orderd ever 6 hours  Encouraged pt to push cold fluids, pt agreed despite poor appetite.  Provided pitcher of ice water and given ice pack to place on neck.  MD modified order for Tylenol to now be PRN PO every 4 hours for fever.

## 2018-10-23 NOTE — Progress Notes (Signed)
Pt cont to refuse blood work.  Pt also only took 1 inhalation of albuterol and refused lovenox.  Importance of medications and treatments explained to the patient.

## 2018-10-23 NOTE — Progress Notes (Signed)
Dr. Dortha Schwalbe notified patient temp 101.5 orally.  Given Tylenol.  No new orders at this time.  Will continue to monitor.

## 2018-10-24 LAB — RENAL FUNCTION PANEL
Albumin: 3 g/dL — ABNORMAL LOW (ref 3.5–5.0)
Anion gap: 12 (ref 5–15)
BUN: 6 mg/dL (ref 6–20)
CO2: 23 mmol/L (ref 22–32)
Calcium: 8.6 mg/dL — ABNORMAL LOW (ref 8.9–10.3)
Chloride: 103 mmol/L (ref 98–111)
Creatinine, Ser: 1 mg/dL (ref 0.44–1.00)
GFR calc Af Amer: >60 mL/min (ref >60)
GFR calc non Af Amer: >60 mL/min (ref >60)
Glucose, Bld: 104 mg/dL — ABNORMAL HIGH (ref 70–99)
Phosphorus: 3.5 mg/dL (ref 2.5–4.6)
Potassium: 3.5 mmol/L (ref 3.5–5.1)
Sodium: 138 mmol/L (ref 135–145)

## 2018-10-24 LAB — CBC WITH DIFFERENTIAL/PLATELET
Abs Immature Granulocytes: 0.02 10*3/uL (ref 0.00–0.07)
Basophils Absolute: 0 10*3/uL (ref 0.0–0.1)
Basophils Relative: 0 %
Eosinophils Absolute: 0 10*3/uL (ref 0.0–0.5)
Eosinophils Relative: 0 %
HCT: 32.5 % — ABNORMAL LOW (ref 36.0–46.0)
Hemoglobin: 10.2 g/dL — ABNORMAL LOW (ref 12.0–15.0)
Immature Granulocytes: 1 %
Lymphocytes Relative: 19 %
Lymphs Abs: 0.7 10*3/uL (ref 0.7–4.0)
MCH: 27.1 pg (ref 26.0–34.0)
MCHC: 31.4 g/dL (ref 30.0–36.0)
MCV: 86.2 fL (ref 80.0–100.0)
Monocytes Absolute: 0.2 10*3/uL (ref 0.1–1.0)
Monocytes Relative: 7 %
Neutro Abs: 2.6 10*3/uL (ref 1.7–7.7)
Neutrophils Relative %: 73 %
Platelets: 260 10*3/uL (ref 150–400)
RBC: 3.77 MIL/uL — ABNORMAL LOW (ref 3.87–5.11)
RDW: 15.4 % (ref 11.5–15.5)
WBC: 3.5 10*3/uL — ABNORMAL LOW (ref 4.0–10.5)
nRBC: 0 % (ref 0.0–0.2)

## 2018-10-24 LAB — NOVEL CORONAVIRUS, NAA (HOSP ORDER, SEND-OUT TO REF LAB; TAT 18-24 HRS): SARS-CoV-2, NAA: DETECTED — AB

## 2018-10-24 MED ORDER — ALBUTEROL SULFATE HFA 108 (90 BASE) MCG/ACT IN AERS
8.0000 | INHALATION_SPRAY | Freq: Three times a day (TID) | RESPIRATORY_TRACT | Status: DC
  Filled 2018-10-24: qty 6.7

## 2018-10-24 MED ORDER — ENOXAPARIN SODIUM 40 MG/0.4ML ~~LOC~~ SOLN
40.0000 mg | SUBCUTANEOUS | Status: DC
  Administered 2018-10-25 – 2018-10-28 (×4): 40 mg via SUBCUTANEOUS
  Filled 2018-10-24 (×4): qty 0.4

## 2018-10-24 MED ORDER — ALBUTEROL SULFATE HFA 108 (90 BASE) MCG/ACT IN AERS
8.0000 | INHALATION_SPRAY | Freq: Three times a day (TID) | RESPIRATORY_TRACT | Status: DC
  Administered 2018-10-24 – 2018-10-28 (×14): 8 via RESPIRATORY_TRACT
  Filled 2018-10-24: qty 6.7

## 2018-10-24 MED ORDER — AZITHROMYCIN 250 MG PO TABS
250.0000 mg | ORAL_TABLET | Freq: Every day | ORAL | Status: DC
  Administered 2018-10-24: 11:00:00 250 mg via ORAL
  Filled 2018-10-24: qty 1

## 2018-10-24 MED ORDER — SODIUM CHLORIDE 0.9 % IV SOLN
1.0000 g | INTRAVENOUS | Status: AC
  Administered 2018-10-24 – 2018-10-26 (×3): 1 g via INTRAVENOUS
  Filled 2018-10-24 (×3): qty 10

## 2018-10-24 NOTE — Progress Notes (Signed)
Patient refused to get Lovenox shot, educated on the importance of this medication. Will continue to monitor patient.

## 2018-10-24 NOTE — Progress Notes (Signed)
  Date: 10/24/2018  Patient name: Evelyn Joyce  Medical record number: [id number]  Date of birth: 02/27/1976    Subjective: Still feeling pretty bad.  No appetite, but able to drink okay.  Breathing feels about the same.  Quite fatigued.  Having persistent fevers.  COVID-19 came back positive  Objective:  Vital signs in last 24 hours: Vitals:   10/24/18 0600 10/24/18 0630 10/24/18 0827 10/24/18 1118  BP:   (!) 105/46 127/74  Pulse: 70 66 75 72  Resp:    (!) 30  Temp:  99.7 F (37.6 C) 99.4 F (37.4 C) 98.4 F (36.9 C)  TempSrc:  Oral  Oral  SpO2: 93% 96% 94% 92%  Weight:      Height:        Physical Exam: General: Lying in bed, appears tired HEENT: Nasal cannula in place, mucous membranes moist Heart: Regular rate and rhythm, no murmurs Lungs: Mild tachypnea to the 20s, normal respiratory effort, bilateral crackles Abdomen: Soft, nontender, normal bowel sounds Extremities: No swelling  Significant new test results: COVID-19 positive Creatinine 1.2 (from 1.0) WBC 3.5, 19% lymphocytes Blood culture no growth to date  Assessment/Plan:  Principal Problem:   Pneumonia due to COVID-19 virus Active Problems:   Acute respiratory failure with hypoxia (HCC)   AKI (acute kidney injury) (HCC)  43 year old woman with no significant PMH admitted with acute hypoxic respiratory failure due to COVID-19 pneumonia.  #Acute hypoxic respiratory failure due to COVID-19 pneumonia: Discussed case with infectious disease team - switch azithromycin to hydroxychloroquine with QTC monitoring - continue ceftriaxone for possible superimposed bacterial pneumonia - continue respiratory support with nasal cannula for now, but concerning for possible progression of needs, will monitor carefully -Continue contact and droplet precautions -Acetaminophen for fevers and pain control  #Acute kidney injury: Likely due to volume depletion from her illness, improved with IV fluids and oral intake, we  will trend every other day for now  FEN: Regular diet Prophylaxis: Enoxaparin for DVT PPX Code Status: Full code  Dispo: Continues to require inpatient care for hypoxic respiratory failure, likely may progress some before she improves, will need to have significant improvement before she is ready for discharge  Jessy Oto, M.D., Ph.D. 10/24/2018, 11:40 AM

## 2018-10-25 LAB — COMPREHENSIVE METABOLIC PANEL
ALT: 16 U/L (ref 0–44)
AST: 27 U/L (ref 15–41)
Albumin: 3 g/dL — ABNORMAL LOW (ref 3.5–5.0)
Alkaline Phosphatase: 58 U/L (ref 38–126)
Anion gap: 13 (ref 5–15)
BUN: 6 mg/dL (ref 6–20)
CO2: 22 mmol/L (ref 22–32)
Calcium: 8.8 mg/dL — ABNORMAL LOW (ref 8.9–10.3)
Chloride: 106 mmol/L (ref 98–111)
Creatinine, Ser: 1.03 mg/dL — ABNORMAL HIGH (ref 0.44–1.00)
GFR calc Af Amer: >60 mL/min (ref >60)
GFR calc non Af Amer: >60 mL/min (ref >60)
Glucose, Bld: 100 mg/dL — ABNORMAL HIGH (ref 70–99)
Potassium: 3.7 mmol/L (ref 3.5–5.1)
Sodium: 141 mmol/L (ref 135–145)
Total Bilirubin: 0.6 mg/dL (ref 0.3–1.2)
Total Protein: 7 g/dL (ref 6.5–8.1)

## 2018-10-25 LAB — CBC
HCT: 33.6 % — ABNORMAL LOW (ref 36.0–46.0)
Hemoglobin: 10.3 g/dL — ABNORMAL LOW (ref 12.0–15.0)
MCH: 26.4 pg (ref 26.0–34.0)
MCHC: 30.7 g/dL (ref 30.0–36.0)
MCV: 86.2 fL (ref 80.0–100.0)
Platelets: 286 10*3/uL (ref 150–400)
RBC: 3.9 MIL/uL (ref 3.87–5.11)
RDW: 15.4 % (ref 11.5–15.5)
WBC: 3.1 10*3/uL — ABNORMAL LOW (ref 4.0–10.5)
nRBC: 0 % (ref 0.0–0.2)

## 2018-10-25 LAB — FERRITIN: Ferritin: 29 ng/mL (ref 11–307)

## 2018-10-25 LAB — FIBRINOGEN: Fibrinogen: 576 mg/dL — ABNORMAL HIGH (ref 210–475)

## 2018-10-25 LAB — C-REACTIVE PROTEIN: CRP: 9.4 mg/dL — ABNORMAL HIGH (ref <1.0)

## 2018-10-25 LAB — TROPONIN I: Troponin I: <0.03 ng/mL (ref <0.03)

## 2018-10-25 LAB — D-DIMER, QUANTITATIVE: D-Dimer, Quant: 5.73 ug/mL-FEU — ABNORMAL HIGH (ref 0.00–0.50)

## 2018-10-25 NOTE — TOC Initial Note (Signed)
Transition of Care [facility]) - Initial/Assessment Note    Patient Details  Name: Evelyn Joyce MRN: [id number] Date of Birth: [birth year]  Transition of Care [facility]) CM/SW Contact:    Gala Lewandowsky, RN Phone Number: 10/25/2018, 3:55 PM  Clinical Narrative: Pt presented for Shortness of breath. Per MD respiratory failure due to COVID-19. PTA from home with children. Pt continues on IV Rocephin and po plaquenil. Pt looks like she is without PCP at this time. CM will continue to monitor for additional transition of care needs.                 Expected Discharge Plan: Home/Self Care Barriers to Discharge: Continued Medical Work up   Patient Goals and CMS Choice        Expected Discharge Plan and Services Expected Discharge Plan: Home/Self Care   Discharge Planning Services: CM Consult   Living arrangements for the past 2 months: Single Family Home Expected Discharge Date: 10/25/18                        Prior Living Arrangements/Services Living arrangements for the past 2 months: Single Family Home Lives with:: Minor Children Patient language and need for interpreter reviewed:: Yes Do you feel safe going back to the place where you live?: Yes      Need for Family Participation in Patient Care: No (Comment) Care giver support system in place?: No (comment)   Criminal Activity/Legal Involvement Pertinent to Current Situation/Hospitalization: No - Comment as needed  Activities of Daily Living Home Assistive Devices/Equipment: None ADL Screening (condition at time of admission) Patient's cognitive ability adequate to safely complete daily activities?: Yes Is the patient deaf or have difficulty hearing?: No Does the patient have difficulty seeing, even when wearing glasses/contacts?: No Does the patient have difficulty concentrating, remembering, or making decisions?: No Patient able to express need for assistance with ADLs?: No Does the patient have difficulty  dressing or bathing?: No Independently performs ADLs?: Yes (appropriate for developmental age) Communication: Independent Dressing (OT): Independent Grooming: Independent Feeding: Independent Bathing: Independent Toileting: Independent In/Out Bed: Independent Walks in Home: Independent Does the patient have difficulty walking or climbing stairs?: No Weakness of Legs: None Weakness of Arms/Hands: None  Permission Sought/Granted Permission sought to share information with : Case Manager                Emotional Assessment       Orientation: : Oriented to Self, Oriented to Place, Oriented to  Time, Oriented to Situation Alcohol / Substance Use: Not Applicable Psych Involvement: No (comment)  Admission diagnosis:  Acute respiratory failure with hypoxia (HCC) [J96.01] Patient Active Problem List   Diagnosis Date Noted  . Morbid obesity (HCC) 10/25/2018  . Pneumonia due to COVID-19 virus   . AKI (acute kidney injury) (HCC)   . Acute respiratory failure with hypoxia (HCC) 10/22/2018  . PALPITATIONS 07/17/2010  . GERD 07/16/2010  . CHEST PAIN 07/16/2010  . ELECTROCARDIOGRAM, ABNORMAL 07/16/2010   PCP:  Patient, No Pcp Per Pharmacy:   [facility] - [place], [place] - [address] [place] [address] Phone: [phone] Fax: [phone]  [id number] - [place], [place] - [address] AT [facility] [address] [place] [address] Phone: [phone] Fax: [phone]     Social Determinants of Health (SDOH) Interventions    Readmission Risk Interventions No flowsheet  data found.

## 2018-10-25 NOTE — Progress Notes (Addendum)
  Date: 10/25/2018  Patient name: Evelyn Joyce  Medical record number: [id number]  Date of birth: [birth year]    Subjective: Feels about the same as yesterday, still feeling short of breath.  O2 support decreased to 3 L.  Objective:  Vital signs in last 24 hours: Vitals:   10/24/18 2000 10/24/18 2231 10/25/18 0047 10/25/18 0512  BP: 111/75 104/72 112/64 127/70  Pulse: 75 85 73 75  Resp: 14 (!) 26 18 20   Temp: 99.1 F (37.3 C) 99.1 F (37.3 C) 99 F (37.2 C) 98.9 F (37.2 C)  TempSrc: Oral Oral Oral Oral  SpO2: 95% 97% 99% 99%  Weight:    112.8 kg  Height:        Physical Exam: General: Appears tired, but slightly more energetic than yesterday, mild respiratory distress Heart: Regular rate and rhythm, no murmurs Lungs: Tachypneic to the 20s with shallow breathing and mildly increased respiratory effort, bilateral crackles Abdomen: Soft, nontender, normal bowel sounds Extremities: No swelling, normal cap refill  Significant new test results: Creatinine 1.0 Troponin negative Ferritin 29 CRP 9.4 WBC 3.1 D-dimer 5.7 Fibrinogen 576  Assessment/Plan:  Principal Problem:   Pneumonia due to COVID-19 virus Active Problems:   Acute respiratory failure with hypoxia (HCC)   AKI (acute kidney injury) (HCC)  43 year old woman with no significant PMH admitted with acute hypoxic respiratory failure due to COVID-19 pneumonia.  #Acute hypoxic respiratory failure due to COVID-19 pneumonia:  O2 support decreased 1 L from yesterday to 3 L nasal cannula, hopefully she has plateaued and will slowly improve.  Labs overall indicate ongoing severe disease with elevated CRP and d-dimer, persistent leukopenia.  Fortunately no signs of cardiac involvement at this time -Continue hydroxychloroquine with QTC monitoring -Continue ceftriaxone for possible superimposed bacterial pneumonia -Continue supplemental O2 for sat less than 92% -Continue contact and droplet precautions -Acetaminophen for  fevers and pain control  #Acute kidney injury: Likely due to volume depletion from her illness, improved with IV fluids and oral intake, we will trend every other day for now, we will continue to avoid aggressive fluid resuscitation given her risk for progression to ARDS  FEN: Regular diet Prophylaxis: Enoxaparin for DVT PPX, discussed with her at length today, she is agreeable to get the enoxaparin shot today, we discussed with her elevated d-dimer her risk for clotting is high Code Status: Full code  Dispo: Continues to require inpatient care for hypoxic respiratory failure, will need to have significant improvement before she is ready for discharge  Jessy Oto, M.D., Ph.D. 10/25/2018, 1:17 PM   Addendum: For documentation purposes, please note she also has morbid obesity

## 2018-10-26 ENCOUNTER — Encounter (HOSPITAL_COMMUNITY): Payer: Self-pay

## 2018-10-26 LAB — HEPATITIS B SURFACE ANTIGEN: Hepatitis B Surface Ag: NEGATIVE

## 2018-10-26 MED ORDER — ENSURE ENLIVE PO LIQD
237.0000 mL | Freq: Three times a day (TID) | ORAL | Status: DC
  Administered 2018-10-26 – 2018-10-27 (×3): 237 mL via ORAL

## 2018-10-26 NOTE — Plan of Care (Signed)
  Problem: Activity: Goal: Ability to tolerate increased activity will improve Outcome: Not Progressing   

## 2018-10-26 NOTE — TOC Progression Note (Signed)
Transition of Care [facility]) - Progression Note    Patient Details  Name: Evelyn Joyce MRN: [id number] Date of Birth: 02-27-1976  Transition of Care [facility]) CM/SW Contact  Evelyn Haven, RN Phone Number: 10/26/2018, 10:24 AM  Clinical Narrative:    From home , pta indep, she states she has PCP Evelyn Joyce, will set up telehealth apt for her.  She has no DME at home no need for any, she states she still drives and has transportation at discharge. She has funds for the [facility] pharmacy to fill her prescriptions for her, referral made to [facility] pharmacy and MD informed to send scripts to [facility].  NCM will assist with medications with Match Letter, she has family planning Medicaid which does not cover her medications.    Expected Discharge Plan: Home/Self Care Barriers to Discharge: Inadequate or no insurance, Continued Medical Work up  Expected Discharge Plan and Services Expected Discharge Plan: Home/Self Care   Discharge Planning Services: MATCH Program, Follow-up appt scheduled, CM Consult, Other - See comment(TOC pharmacy) Post Acute Care Choice: NA Living arrangements for the past 2 months: Single Family Home Expected Discharge Date: 10/25/18               DME Arranged: N/A DME Agency: NA HH Arranged: NA HH Agency: NA   Social Determinants of Health (SDOH) Interventions    Readmission Risk Interventions Readmission Risk Prevention Plan 10/26/2018  Medication Screening Complete  Transportation Screening Complete  Some recent data might be hidden

## 2018-10-26 NOTE — Progress Notes (Signed)
  Date: 10/26/2018  Patient name: Evelyn Joyce  Medical record number: [id number]  Date of birth: 26-Oct-1975    Subjective: Feels slightly better today.  Still worried about when she will be able to go home.  Asked me today "I am not going to die here, am I?"  Still not much appetite, but drinking okay.  Was able to take enoxaparin shot yesterday, disappointed that she will need it every day  Objective:  Vital signs in last 24 hours: Vitals:   10/26/18 0354 10/26/18 0736 10/26/18 0800 10/26/18 0900  BP: 124/60 (!) 126/58 137/75 122/61  Pulse: 66 69 65 62  Resp: 18 20    Temp: 98.8 F (37.1 C) 97.7 F (36.5 C)    TempSrc: Oral Oral    SpO2: 100% 95% 97% 98%  Weight:      Height:        Physical Exam: General: More alert today, interactive, able to talk a little more Heart: Regular rate and rhythm, no murmurs Lungs: Tachypneic to the 20s with shallow breathing, normal respiratory effort, bilateral crackles Abdomen: Soft, nontender, normal bowel sounds Extremities: No swelling, normal cap refill  Significant new test results: None  Assessment/Plan:  Principal Problem:   Pneumonia due to COVID-19 virus Active Problems:   Acute respiratory failure with hypoxia (HCC)   AKI (acute kidney injury) (HCC)   Morbid obesity (HCC)  43 year old woman with no significant PMH admitted with acute hypoxic respiratory failure due to COVID-19 pneumonia.  Slowly improving  #Acute hypoxic respiratory failure due to COVID-19 pneumonia:  O2 down to 2.5 L [place].    Remains afebrile now for about 48 hours, energy level getting a little better.  We need to start working on her nutrition and activity level. -Continue hydroxychloroquine with QTC monitoring (completed day 3 of 5) -Continue ceftriaxone for possible superimposed bacterial pneumonia -Continue supplemental O2 for sat less than 92%, will try to wean -Continue contact and droplet precautions -Acetaminophen for fevers and pain control -  Out of bed today, will consult PT - Ordered Ensure, will try to start improving nutrition  #Acute kidney injury:Mostly resolved with fluids, seems to be taking adequate fluids orally  VQM:GQQPYPP diet Prophylaxis:Enoxaparin for DVT PPX Code Status:Full code  Dispo:Continues to require inpatient care for hypoxic respiratory failure, will need to have significant improvement before she is ready for discharge, will start working on activity level today so that when she is weaned off of oxygen she will be ready to discharge  Jessy Oto, M.D., Ph.D. 10/26/2018, 11:50 AM

## 2018-10-27 ENCOUNTER — Encounter (HOSPITAL_COMMUNITY): Payer: Self-pay

## 2018-10-27 LAB — CULTURE, BLOOD (ROUTINE X 2)
Culture: NO GROWTH
Special Requests: ADEQUATE

## 2018-10-27 NOTE — Progress Notes (Signed)
[facility] P[facility]M, [facility] CMO  I called Ms. Evelyn Joyce tonight to let her know that we have identified her as a candidate for transfer to [facility]. She voiced understanding.   [place], MD [place] PCCM Pager: [phone] Cell: [phone] If no response, call [phone]

## 2018-10-27 NOTE — Progress Notes (Signed)
  Date: 10/27/2018  Patient name: Evelyn Joyce  Medical record number: [id number]  Date of birth: 04-Mar-1976    Subjective: She reports feeling a little bit better today.  Still not much energy and no appetite.  Drank a little bit of the Ensure.  She would like to know if she can still take the ashwaghanda root that she takes for anxiety.  Feels overwhelmed about the thought of getting out of bed.  Objective:  Vital signs in last 24 hours: Vitals:   10/26/18 2305 10/27/18 0349 10/27/18 0725 10/27/18 0937  BP: 131/65 132/66 127/78 128/66  Pulse: 71 65 65 71  Resp: 15 18 (!) 37 (!) 33  Temp: 98.2 F (36.8 C) 98.8 F (37.1 C) 98.6 F (37 C)   TempSrc: Oral Oral Oral   SpO2: 96% 100% 97% 97%  Weight:      Height:        Physical Exam: General: Lying in bed, alert, appears comfortable Heart: Regular rate and rhythm, no murmurs Lungs: Tachypneic to about 30, shallow breathing, lungs sound clear but exam limited by isolation stethoscope and noisy room Abdomen: Soft, nontender, normal bowel sounds Extremities: No swelling, normal cap refill  Significant new test results: None  Assessment/Plan:  Principal Problem:   Pneumonia due to COVID-19 virus Active Problems:   Acute respiratory failure with hypoxia (HCC)   AKI (acute kidney injury) (HCC)   Morbid obesity (HCC)  43 year old woman with no significant PMH admitted with acute hypoxic respiratory failure due to COVID-19 pneumonia.  Slowly improving  #Acute hypoxic respiratory failure due to COVID-19 pneumonia:  I personally decreased her O2 down to 2 L [place] this morning.   Remains afebrile, energy level getting a little better.  We need to start working on her nutrition and activity level, and she is anxious about this but agreeable.  I told her that her jobs for today are to get out of bed to the chair and to try to take little more nutrition.  I explained that getting out of bed may help her lung function as well.  -Continuehydroxychloroquine with QTC monitoring (completed day 4 of 5) -Continue ceftriaxone for possible superimposed bacterial pneumonia -Continue supplemental O2 for sat less than 92%, will try to wean, O2 sat remains 95 to 100% -Continue contact and droplet precautions -Acetaminophen for fevers and pain control - Out of bed today, continue working on nutrition - Repeat labs tomorrow  #Acute kidney injury:Mostly resolved with fluids, seems to be taking adequate fluids orally, will recheck tomorrow  JPV:GKKDPTE diet Prophylaxis:Enoxaparin for DVT PPX, reinforced that this is very important given the hypercoagulability with COVID-19 Code Status:Full code  Dispo:Continues to require inpatient care for hypoxic respiratory failure, will need to have significant improvement before she is ready for discharge, will start working on activity level today so that when she is weaned off of oxygen she will be ready to discharge  Jessy Oto, M.D., Ph.D. 10/27/2018, 12:09 PM

## 2018-10-28 DIAGNOSIS — J1289 Other viral pneumonia: Secondary | ICD-10-CM

## 2018-10-28 DIAGNOSIS — J9601 Acute respiratory failure with hypoxia: Secondary | ICD-10-CM

## 2018-10-28 DIAGNOSIS — N179 Acute kidney failure, unspecified: Secondary | ICD-10-CM

## 2018-10-28 LAB — COMPREHENSIVE METABOLIC PANEL
ALT: 13 U/L (ref 0–44)
AST: 24 U/L (ref 15–41)
Albumin: 3.2 g/dL — ABNORMAL LOW (ref 3.5–5.0)
Alkaline Phosphatase: 57 U/L (ref 38–126)
Anion gap: 14 (ref 5–15)
BUN: 10 mg/dL (ref 6–20)
CO2: 23 mmol/L (ref 22–32)
Calcium: 9.4 mg/dL (ref 8.9–10.3)
Chloride: 101 mmol/L (ref 98–111)
Creatinine, Ser: 0.93 mg/dL (ref 0.44–1.00)
GFR calc Af Amer: >60 mL/min (ref >60)
GFR calc non Af Amer: >60 mL/min (ref >60)
Glucose, Bld: 97 mg/dL (ref 70–99)
Potassium: 3.8 mmol/L (ref 3.5–5.1)
Sodium: 138 mmol/L (ref 135–145)
Total Bilirubin: 0.7 mg/dL (ref 0.3–1.2)
Total Protein: 7.4 g/dL (ref 6.5–8.1)

## 2018-10-28 LAB — CBC
HCT: 34.2 % — ABNORMAL LOW (ref 36.0–46.0)
Hemoglobin: 10.3 g/dL — ABNORMAL LOW (ref 12.0–15.0)
MCH: 25.9 pg — ABNORMAL LOW (ref 26.0–34.0)
MCHC: 30.1 g/dL (ref 30.0–36.0)
MCV: 85.9 fL (ref 80.0–100.0)
Platelets: 388 10*3/uL (ref 150–400)
RBC: 3.98 MIL/uL (ref 3.87–5.11)
RDW: 15 % (ref 11.5–15.5)
WBC: 2.8 10*3/uL — ABNORMAL LOW (ref 4.0–10.5)
nRBC: 0 % (ref 0.0–0.2)

## 2018-10-28 LAB — TROPONIN I
Troponin I: <0.03 ng/mL (ref <0.03)
Troponin I: <0.03 ng/mL (ref <0.03)

## 2018-10-28 LAB — FIBRINOGEN: Fibrinogen: 576 mg/dL — ABNORMAL HIGH (ref 210–475)

## 2018-10-28 LAB — D-DIMER, QUANTITATIVE: D-Dimer, Quant: 2.07 ug/mL-FEU — ABNORMAL HIGH (ref 0.00–0.50)

## 2018-10-28 LAB — C-REACTIVE PROTEIN: CRP: 4.7 mg/dL — ABNORMAL HIGH (ref <1.0)

## 2018-10-28 LAB — FERRITIN: Ferritin: 44 ng/mL (ref 11–307)

## 2018-10-28 MED ORDER — ALBUTEROL SULFATE HFA 108 (90 BASE) MCG/ACT IN AERS: 8.0000 | INHALATION_SPRAY | Freq: Three times a day (TID) | RESPIRATORY_TRACT | 0 refills | Status: DC

## 2018-10-28 MED FILL — VENTOLIN HFA 90 MCG INHALER: 108 (90 BAS | 18 days supply | Qty: 18 | Fill #0

## 2018-10-28 NOTE — Progress Notes (Signed)
  Date: 10/28/2018  Patient name: Evelyn Joyce  Medical record number: [id number]  Date of birth: 02-10-1976    Subjective: Feeling much better today.  Off of oxygen this morning, able to get washed up and move around the room with minimal shortness of breath.  Still not much appetite.  Anxious to go home.  Objective:  Vital signs in last 24 hours: Vitals:   10/27/18 2325 10/28/18 0336 10/28/18 0700 10/28/18 0900  BP: (!) 99/31 113/65 127/68   Pulse: 74 69 68   Resp: 14 (!) 21 (!) 22   Temp: 97.8 F (36.6 C) (!) 97.5 F (36.4 C) 97.8 F (36.6 C)   TempSrc: Oral Oral Oral   SpO2: 91% 90% 99% 97%  Weight:      Height:        Physical Exam: General: Sitting up in chair, comfortable and increased energy Heart: Regular rate and rhythm, no murmurs Lungs: Bilateral crackles, normal respiratory rate and effort Abdomen: Soft nontender, normal bowel sounds Extremities: No swelling, normal cap refill  Significant new test results: CRP 4.7 (from 9.4) Ferritin 44 (from 29) D-dimer 2.1 (from 5.7) Fibrinogen 576 Troponin negative WBC 2.8 (from 3.1) Albumin 3.2 (from 3.0) Creatinine 0.9 (from 1.0)  Assessment/Plan:  Principal Problem:   Pneumonia due to COVID-19 virus Active Problems:   Acute respiratory failure with hypoxia (HCC)   AKI (acute kidney injury) (HCC)   Morbid obesity (HCC)  43 year old woman with no significant PMH admitted with acute hypoxic respiratory failure due to COVID-19 pneumonia.Had been improving slowly for days, but rapid improvement since yesterday.  #Acute hypoxic respiratory failure due to COVID-19 pneumonia:Significant improvement since yesterday, off of oxygen and able to get up and around with only a little bit of shortness of breath. Afebrile for 96 hours but has been receiving intermittent acetaminophen for pain.  Labs have improved, although d-dimer is still quite elevated at 2 and CRP is still elevated at 4.7  She is getting quite close to  discharge, but since she was just on oxygen last night, would prefer to watch her through the end of the day today.  She is agreeable to this plan.  Discussed with Dr. Sharon Seller who was to receive her in transfer at [facility] today, he is in agreement with keeping her here since she is so close to discharge.  Also discussed with unit director and she is in agreement with this plan.  Hopeful for discharge tonight or tomorrow depending on how today goes. -Completed course of hydroxychloroquine for 5 days -Completed 5-day course of ceftriaxone -Weaned from O2, sat this morning 96% on room air -Continue contact and droplet precautions -Acetaminophen for fevers and pain control  #Acute kidney injury: resolved with fluids, seems to be taking adequate fluids orally  MVV:KPQAESL diet Prophylaxis:Enoxaparin for DVT PPX, reinforced that this is very important given the hypercoagulability with COVID-19 Code Status:Full code  Dispo:Anticipate discharge later today or tomorrow.  Jessy Oto, M.D., Ph.D. 10/28/2018, 11:21 AM

## 2018-10-28 NOTE — Discharge Summary (Addendum)
Physician Discharge Summary  Patient ID: Evelyn Joyce MRN: [id number] DOB/AGE: 11/26/1975 43 y.o.  Admit date: 10/22/2018 Discharge date: 10/28/2018  Admission Diagnoses: Pneumonia  Discharge Diagnoses:  Principal Problem:   Pneumonia due to COVID-19 virus Active Problems:   Acute respiratory failure with hypoxia (HCC)   Morbid obesity (HCC)   Discharged Condition: good  Hospital Course: Evelyn Joyce is a 43 year old woman with no significant PMH except for morbid obesity who presented with fever, cough, and shortness of breath.  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  Chest x-ray showed bilateral patchy airspace disease and COVID-19 testing was positive.  She was hypoxic and required 3.5 L O2 by [place].  She was initially started on ceftriaxone and azithromycin for CAP, but when COVID-19 testing returned positive, azithromycin was switched to hydroxychloroquine 400 mg twice daily for the first day, followed by 4 days of 200 mg twice daily.  She completed 5 days of ceftriaxone for possible superimposed bacterial infection.  Labs were significant for CRP 9.4, d-dimer 5.7, troponin negative, fibrinogen 576.  Although she initially refused enoxaparin DVT prophylaxis, I discussed the importance of preventing blood clots with COVID-19, and she eventually agreed and was treated with enoxaparin through the day of discharge.  On the day of discharge, her labs had improved somewhat, but CRP was still elevated at 4.7 and d-dimer was still elevated at 2.1.  She had a mild acute kidney injury on presentation with creatinine of 1.2, which improved with gentle fluid hydration.  Creatinine at discharge is 0.9.  She was weaned from oxygen was able to be active around her room and bathe herself without shortness of breath or hypoxia.  She was anxious to go home and preferred to go home in the evening rather than wait for discharge in the morning.  Consults: None  Significant Diagnostic Studies:  SARS-CoV-2  positive Influenza negative WBC 3.6, 3.5, 3.1, 2.8, 19% lymphocytes Creatinine 1.2, 0.9 D-dimer 5.7, 2.1 Troponin negative x4 CRP 9.4, 4.7 Ferritin 29, 44  Treatments: IV hydration and antibiotics: ceftriaxone and azithromycin, then ceftriaxone and hydroxychloroquine  Discharge Exam: Blood pressure 127/68, pulse 68, temperature 97.8 F (36.6 C), temperature source Oral, resp. rate (!) 22, height 5\' 5"  (1.651 m), weight 112.8 kg, SpO2 97 %. General: Sitting up in chair, comfortable and increased energy, cheerful Heart: Regular rate and rhythm, no murmurs Lungs: Bilateral crackles, normal respiratory rate and effort Abdomen: Soft nontender, normal bowel sounds Extremities: No swelling, normal cap refill Neuro: Mental status alert and interactive, speech normal, smile symmetric, strength and sensation symmetric and intact  Disposition: Discharge disposition: 01-Home or Self Care       Discharge Instructions    Call MD for:  difficulty breathing, headache or visual disturbances   Complete by:  As directed    Call MD for:  persistant nausea and vomiting   Complete by:  As directed    Call MD for:  severe uncontrolled pain   Complete by:  As directed    Call MD for:  temperature >100.4   Complete by:  As directed    Diet - low sodium heart healthy   Complete by:  As directed    Increase activity slowly   Complete by:  As directed    MyChart COVID-19 home monitoring program   Complete by:  Oct 28, 2018    Is the patient willing to use a smartphone for remote monitoring via the MyChart app?:  Yes   Temperature monitoring   Complete by:  Oct 28, 2018    After how many days would you like to receive a notification of this patient's flowsheet entries?:  1     Allergies as of 10/28/2018      Reactions   Iodine Itching, Rash   LOCALIZED REACTION      Medication List    STOP taking these medications   acetaminophen 500 MG tablet Commonly known as:  TYLENOL   BC HEADACHE  POWDER PO   ibuprofen 600 MG tablet Commonly known as:  ADVIL   naproxen 500 MG tablet Commonly known as:  Naprosyn   TYLENOL COLD/FLU SEVERE PO     TAKE these medications   albuterol 108 (90 Base) MCG/ACT inhaler Commonly known as:  VENTOLIN HFA Inhale 8 puffs into the lungs 3 (three) times daily.   multivitamin Liqd Take 30 mLs by mouth daily. Nutriburst      Follow-up Information    Levora Dredge, MD Follow up.   Specialty:  Internal Medicine Why:  We will call you to arrange a follow up appointment with one of our doctors. Contact information: [address] [place] [address] [phone]           Signed: Anne Shutter 10/28/2018, 4:51 PM

## 2018-10-28 NOTE — Discharge Instructions (Signed)
Thank you for trusting [place] with your care! I am so glad you are feeling better.  Please call your clinic if you have fever, difficulty breathing, chest pain, chest tightness, vomiting, leg pain, or leg swelling.  Please be sure to keep yourself in quarantine at home as you could still infect other people. Try to mostly keep to your own room, wear a mask when you leave your room, and wash your hands frequently, especially before and after eating and touching your face. Please do not go out in public, and ask others to get groceries and other necessities for you.  Please see the information below from [facility] for additional information about keeping yourself and others safe.  You may use the albuterol inhaler up to every 4 hours for shortness of breath or wheezing. If you need it more frequently, please call the clinic.       Person Under Monitoring Name: Evelyn Joyce  Location: [address] [place] [address]   Infection Prevention Recommendations for Individuals Confirmed to have, or Being Evaluated for, 2019 Novel Coronavirus (COVID-19) Infection Who Receive Care at Home  Individuals who are confirmed to have, or are being evaluated for, COVID-19 should follow the prevention steps below until a healthcare provider or local or state health department says they can return to normal activities.  Stay home except to get medical care You should restrict activities outside your home, except for getting medical care. Do not go to work, school, or public areas, and do not use public transportation or taxis.  Call ahead before visiting your doctor Before your medical appointment, call the healthcare provider and tell them that you have, or are being evaluated for, COVID-19 infection. This will help the healthcare providers office take steps to keep other people from getting infected. Ask your healthcare provider to call the local or state health department.  Monitor your symptoms Seek  prompt medical attention if your illness is worsening (e.g., difficulty breathing). Before going to your medical appointment, call the healthcare provider and tell them that you have, or are being evaluated for, COVID-19 infection. Ask your healthcare provider to call the local or state health department.  Wear a facemask You should wear a facemask that covers your nose and mouth when you are in the same room with other people and when you visit a healthcare provider. People who live with or visit you should also wear a facemask while they are in the same room with you.  Separate yourself from other people in your home As much as possible, you should stay in a different room from other people in your home. Also, you should use a separate bathroom, if available.  Avoid sharing household items You should not share dishes, drinking glasses, cups, eating utensils, towels, bedding, or other items with other people in your home. After using these items, you should wash them thoroughly with soap and water.  Cover your coughs and sneezes Cover your mouth and nose with a tissue when you cough or sneeze, or you can cough or sneeze into your sleeve. Throw used tissues in a lined trash can, and immediately wash your hands with soap and water for at least 20 seconds or use an alcohol-based hand rub.  Wash your [employer] your hands often and thoroughly with soap and water for at least 20 seconds. You can use an alcohol-based hand sanitizer if soap and water are not available and if your hands are not visibly dirty. Avoid touching  your eyes, nose, and mouth with unwashed hands.   Prevention Steps for Caregivers and Household Members of Individuals Confirmed to have, or Being Evaluated for, COVID-19 Infection Being Cared for in the Home  If you live with, or provide care at home for, a person confirmed to have, or being evaluated for, COVID-19 infection please follow these guidelines to prevent  infection:  Follow healthcare providers instructions Make sure that you understand and can help the patient follow any healthcare provider instructions for all care.  Provide for the patients basic needs You should help the patient with basic needs in the home and provide support for getting groceries, prescriptions, and other personal needs.  Monitor the patients symptoms If they are getting sicker, call his or her medical provider and tell them that the patient has, or is being evaluated for, COVID-19 infection. This will help the healthcare providers office take steps to keep other people from getting infected. Ask the healthcare provider to call the local or state health department.  Limit the number of people who have contact with the patient  If possible, have only one caregiver for the patient.  Other household members should stay in another home or place of residence. If this is not possible, they should stay  in another room, or be separated from the patient as much as possible. Use a separate bathroom, if available.  Restrict visitors who do not have an essential need to be in the home.  Keep older adults, very young children, and other sick people away from the patient Keep older adults, very young children, and those who have compromised immune systems or chronic health conditions away from the patient. This includes people with chronic heart, lung, or kidney conditions, diabetes, and cancer.  Ensure good ventilation Make sure that shared spaces in the home have good air flow, such as from an air conditioner or an opened window, weather permitting.  Wash your hands often  Wash your hands often and thoroughly with soap and water for at least 20 seconds. You can use an alcohol based hand sanitizer if soap and water are not available and if your hands are not visibly dirty.  Avoid touching your eyes, nose, and mouth with unwashed hands.  Use disposable paper towels  to dry your hands. If not available, use dedicated cloth towels and replace them when they become wet.  Wear a facemask and gloves  Wear a disposable facemask at all times in the room and gloves when you touch or have contact with the patients blood, body fluids, and/or secretions or excretions, such as sweat, saliva, sputum, nasal mucus, vomit, urine, or feces.  Ensure the mask fits over your nose and mouth tightly, and do not touch it during use.  Throw out disposable facemasks and gloves after using them. Do not reuse.  Wash your hands immediately after removing your facemask and gloves.  If your personal clothing becomes contaminated, carefully remove clothing and launder. Wash your hands after handling contaminated clothing.  Place all used disposable facemasks, gloves, and other waste in a lined container before disposing them with other household waste.  Remove gloves and wash your hands immediately after handling these items.  Do not share dishes, glasses, or other household items with the patient  Avoid sharing household items. You should not share dishes, drinking glasses, cups, eating utensils, towels, bedding, or other items with a patient who is confirmed to have, or being evaluated for, COVID-19 infection.  After the person uses  these items, you should wash them thoroughly with soap and water.  Wash laundry thoroughly  Immediately remove and wash clothes or bedding that have blood, body fluids, and/or secretions or excretions, such as sweat, saliva, sputum, nasal mucus, vomit, urine, or feces, on them.  Wear gloves when handling laundry from the patient.  Read and follow directions on labels of laundry or clothing items and detergent. In general, wash and dry with the warmest temperatures recommended on the label.  Clean all areas the individual has used often  Clean all touchable surfaces, such as counters, tabletops, doorknobs, bathroom fixtures, toilets, phones,  keyboards, tablets, and bedside tables, every day. Also, clean any surfaces that may have blood, body fluids, and/or secretions or excretions on them.  Wear gloves when cleaning surfaces the patient has come in contact with.  Use a diluted bleach solution (e.g., dilute bleach with 1 part bleach and 10 parts water) or a household disinfectant with a label that says EPA-registered for coronaviruses. To make a bleach solution at home, add 1 tablespoon of bleach to 1 quart (4 cups) of water. For a larger supply, add  cup of bleach to 1 gallon (16 cups) of water.  Read labels of cleaning products and follow recommendations provided on product labels. Labels contain instructions for safe and effective use of the cleaning product including precautions you should take when applying the product, such as wearing gloves or eye protection and making sure you have good ventilation during use of the product.  Remove gloves and wash hands immediately after cleaning.  Monitor yourself for signs and symptoms of illness Caregivers and household members are considered close contacts, should monitor their health, and will be asked to limit movement outside of the home to the extent possible. Follow the monitoring steps for close contacts listed on the symptom monitoring form.   ? If you have additional questions, contact your local health department or call the epidemiologist on call at [phone] (available 24/7). ? This guidance is subject to change. For the most up-to-date guidance from [facility], please refer to their website: Trip[facility].hu  You should follow up next week by phone.

## 2018-10-28 NOTE — Plan of Care (Signed)
  Problem: Activity: Goal: Ability to tolerate increased activity will improve Outcome: Progressing   Problem: Clinical Measurements: Goal: Ability to maintain a body temperature in the normal range will improve Outcome: Progressing   Problem: Respiratory: Goal: Ability to maintain adequate ventilation will improve Outcome: Progressing   Problem: Respiratory: Goal: Ability to maintain a clear airway will improve Outcome: Progressing   Problem: Education: Goal: Knowledge of General Education information will improve Description Including pain rating scale, medication(s)/side effects and non-pharmacologic comfort measures Outcome: Progressing   Problem: Clinical Measurements: Goal: Respiratory complications will improve Outcome: Progressing

## 2018-10-29 ENCOUNTER — Telehealth: Payer: Self-pay | Admitting: Internal Medicine

## 2018-10-29 NOTE — Telephone Encounter (Signed)
Called to follow-up with Evelyn Joyce.  She reports she is doing well since discharge.  She still has a little shortness of breath when she gets around, but her cough has decreased.  She has the albuterol inhaler available as needed.  I informed her someone from our clinic will call her on Monday to set up a follow-up appointment.  She was appreciative of our care.

## 2018-11-04 ENCOUNTER — Telehealth (INDEPENDENT_AMBULATORY_CARE_PROVIDER_SITE_OTHER): Payer: Self-pay | Admitting: General Practice

## 2018-11-04 NOTE — Telephone Encounter (Signed)
Called pt to discuss MyChart COVID19 Condition Monitoring. Verified ph# and texted link as pt signed up for phone App. Advise pt I will f/u with her again to check on her survey.

## 2018-11-07 ENCOUNTER — Other Ambulatory Visit: Payer: Self-pay

## 2018-11-07 ENCOUNTER — Ambulatory Visit (INDEPENDENT_AMBULATORY_CARE_PROVIDER_SITE_OTHER): Payer: HRSA Program | Admitting: Internal Medicine

## 2018-11-07 DIAGNOSIS — J1289 Other viral pneumonia: Secondary | ICD-10-CM

## 2018-11-07 NOTE — Assessment & Plan Note (Signed)
Patient recovered from her recent COVID-19 virus illness resulted in bilateral pneumonia.  Her shortness of breath is improving, still feeling little fatigued and tired.  Told patient that this much of fatigued is anticipated and hopefully she will continue to improve. We also discussed that she should follow stay-at-home orders and use mask and frequent handwashing whenever in public.  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

## 2018-11-07 NOTE — Progress Notes (Signed)
   This is a telephone encounter between [name] and Evelyn Joyce on 11/07/2018 for hospital follow-up. The visit was conducted with the patient located at home and Evelyn Joyce at [facility]. The patient's identity was confirmed using their DOB and current address. The patient has consented to being evaluated through a telephone encounter and understands the associated risks (an examination cannot be done and the patient may need to come in for an appointment) / benefits (allows the patient to remain at home, decreasing exposure to coronavirus). I personally spent 10 minutes on medical discussion.  CC: For hospital follow-up after COVID-19 pneumonia.  HPI:  [name] is a 43 y.o. with no significant past medical history was recently admitted at [facility] from April 11 till April 17 due to COVID-19 pneumonia.  Since discharge she is doing well.  Still having mild shortness of breath.  Cough has been resolved.  Denies any more fever. Patient is using mask around the house but started to intermingled with the family. Patient was asking if she can return to work next week as she is still feeling fatigue. Letter to go back to work on May 5 was provided.  No past medical history on file. Review of Systems: Negative except mentioned in HPI.  Physical Exam:  There were no vitals filed for this visit. No physical was done as it was a tele-visit.  Assessment & Plan:   See Encounters Tab for problem based charting.  Patient discussed with Dr. Heide Spark.

## 2018-11-08 NOTE — Progress Notes (Signed)
Internal Medicine Clinic Attending  Case discussed with Dr. Amin at the time of the visit.  We reviewed the resident's history and exam and pertinent patient test results.  I agree with the assessment, diagnosis, and plan of care documented in the resident's note.    

## 2018-11-08 NOTE — Telephone Encounter (Signed)
LM for pt to see if she received text message, if she has questions regarding setup. Requested that pt signup, login, and complete COVID home monitoring.

## 2018-11-10 ENCOUNTER — Telehealth: Payer: Self-pay | Admitting: Internal Medicine

## 2018-11-10 NOTE — Telephone Encounter (Signed)
Will defer to Dr. Nelson Chimes who is in the hospital and spoke with the patient

## 2018-11-10 NOTE — Telephone Encounter (Signed)
Talked with patient, she will come to clinic tomorrow to have a nasopharyngeal swab done for COVID-19 as her manager is not letting her come back to work without a documented negative result. Chilon will give her a call to make an appointment to be seen in [facility].

## 2018-11-10 NOTE — Telephone Encounter (Signed)
Pt f/u with a Work Excuse Note to be written.  Pt also has questions about obtaining a confirmed note that she is negative from  COVID-19 per her manager's request.

## 2018-11-10 NOTE — Telephone Encounter (Signed)
Pt had virtual visit 4/27 and was provided note to return to work 11/15/2018. Will not follow.

## 2018-11-11 ENCOUNTER — Ambulatory Visit: Payer: Medicaid Other

## 2018-11-11 NOTE — Telephone Encounter (Signed)
Per Epic, [facility] visit scheduled Monday 5/4.

## 2018-11-11 NOTE — Telephone Encounter (Signed)
Thanks

## 2018-11-14 ENCOUNTER — Ambulatory Visit: Payer: Medicaid Other

## 2018-11-14 ENCOUNTER — Ambulatory Visit: Payer: Medicaid Other | Admitting: Internal Medicine

## 2018-11-14 ENCOUNTER — Other Ambulatory Visit: Payer: Self-pay

## 2018-11-14 DIAGNOSIS — J1282 Pneumonia due to coronavirus disease 2019: Secondary | ICD-10-CM

## 2018-11-14 DIAGNOSIS — J1289 Other viral pneumonia: Principal | ICD-10-CM

## 2018-11-14 DIAGNOSIS — U071 COVID-19: Secondary | ICD-10-CM

## 2018-11-14 NOTE — Progress Notes (Signed)
This visit was to obtain COVID nasal swab. Please see Dr. Reola Mosher 4/27 note for details.

## 2018-11-15 LAB — INPATIENT

## 2018-11-15 LAB — NOVEL CORONAVIRUS, NAA: SARS-CoV-2, NAA: NOT DETECTED

## 2018-11-18 ENCOUNTER — Telehealth: Payer: Self-pay | Admitting: *Deleted

## 2018-11-18 NOTE — Telephone Encounter (Signed)
Call to patient-verbal consent obtained to mail results.  Pt's identity confirmed with date of birth and last 4 ss#.  Mailing address also confirmed. Pt has already discussed results with DrBlum.  Will mail patient the results of the Novel Coronavirus test..[name]5/8/202010:07 AM

## 2018-11-18 NOTE — Telephone Encounter (Signed)
-----   Message from Lianne Bushy sent at 11/18/2018  9:59 AM EDT ----- I spoke with [name] and notified her of the negative result on her follow up test. She ask that we mail this result to her so home address. Front desk - can this result be mailed to Ms. Evelyn Joyce?

## 2019-09-05 IMAGING — CR PORTABLE CHEST - 1 VIEW
1 series · 1 of 1 positions shown · non-contrast
Comparison: None.

CLINICAL DATA: Shortness of breath, cough, fever, and chest
tightness for 3 days.

EXAM:
PORTABLE CHEST 1 VIEW

[AP]
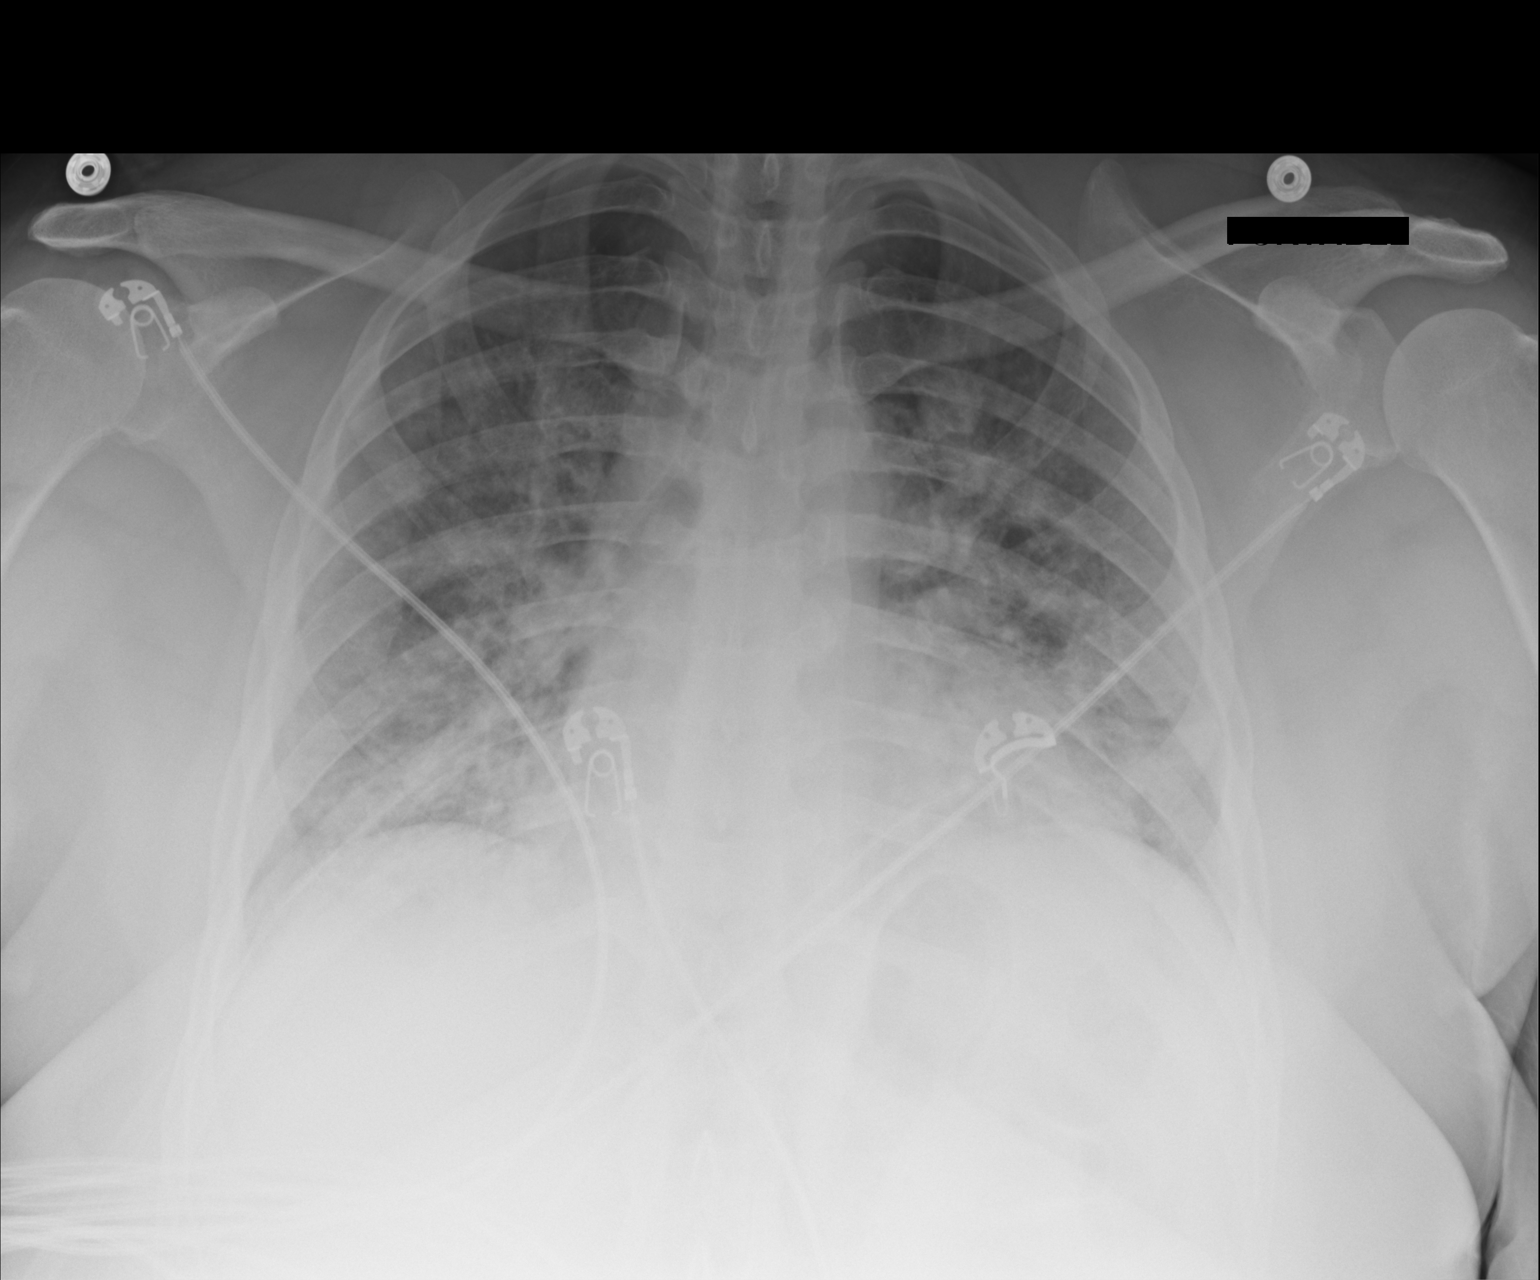

[1 of 1 positions shown; findings below may reference images not displayed]

FINDINGS: The cardiac silhouette is accentuated by portable AP technique and
low lung volumes and is likely normal in size. Patchy airspace
opacity is present in the mid and lower lungs bilaterally. No Kerley
lines, sizeable pleural effusion, or pneumothorax is identified. No
acute osseous abnormality is seen.
IMPRESSION: Bilateral airspace opacities most concerning for pneumonia with this
history.

## 2020-11-08 ENCOUNTER — Encounter (HOSPITAL_COMMUNITY): Payer: Self-pay | Admitting: *Deleted

## 2020-11-08 ENCOUNTER — Emergency Department (HOSPITAL_COMMUNITY)
Admission: EM | Admit: 2020-11-08 | Discharge: 2020-11-08 | Disposition: A | Discharge status: Home / Self Care | Payer: Medicaid Other | Attending: Emergency Medicine | Admitting: Emergency Medicine

## 2020-11-08 ENCOUNTER — Other Ambulatory Visit: Payer: Self-pay

## 2020-11-08 DIAGNOSIS — Z87891 Personal history of nicotine dependence: Secondary | ICD-10-CM | POA: Insufficient documentation

## 2020-11-08 DIAGNOSIS — L0501 Pilonidal cyst with abscess: Secondary | ICD-10-CM | POA: Insufficient documentation

## 2020-11-08 DIAGNOSIS — Z8616 Personal history of COVID-19: Secondary | ICD-10-CM | POA: Insufficient documentation

## 2020-11-08 MED ORDER — LIDOCAINE-EPINEPHRINE 1 %-1:100000 IJ SOLN
20.0000 mL | Freq: Once | INTRAMUSCULAR | Status: AC
  Administered 2020-11-08: 3 mL via INTRADERMAL
  Filled 2020-11-08: qty 1

## 2020-11-08 MED ORDER — NAPROXEN 375 MG PO TABS: 375.0000 mg | ORAL_TABLET | Freq: Two times a day (BID) | ORAL | 0 refills | Status: DC

## 2020-11-08 MED ORDER — ACETAMINOPHEN 325 MG PO TABS
650.0000 mg | ORAL_TABLET | Freq: Once | ORAL | Status: AC
  Administered 2020-11-08: 650 mg via ORAL
  Filled 2020-11-08: qty 2

## 2020-11-08 NOTE — Discharge Instructions (Signed)
INCISION AND DRAINAGE WOUND CARE Please have your packing removed in 2-3 days or sooner if you have concerns. You may do this at any available urgent care or at your primary care doctor's office or return to the ED.  Keep area clean and dry for 24 hours. Do not remove bandage, if applied.  After 24 hours, remove bandage and wash wound gently with mild soap and warm water. Reapply a new bandage after cleaning wound, if directed.  Continue daily cleansing with soap and water   Seek medical careif you experience any of the following signs of infection: Swelling, redness, pus drainage, streaking, fever >101.0 F  Seek care if you experience excessive bleeding that does not stop after 15-20 minutes of constant, firm pressure.

## 2020-11-08 NOTE — ED Triage Notes (Signed)
Abscess between buttocks onset 3 days ago unable to sit down

## 2020-11-08 NOTE — ED Provider Notes (Signed)
[facility] EMERGENCY DEPARTMENT Provider Note   CSN: [id number] Arrival date & time: 11/08/20  6712     History Chief Complaint  Patient presents with  . Abscess    Evelyn Joyce is a 45 y.o. female.  Who presents emergency department chief complaint of abscess.  She has had 3 days of worsening pain and swelling at the superior aspect of her gluteal cleft.  She is never had anything like this before.  She denies fevers or chills, pain with defecation.  HPI     History reviewed. No pertinent past medical history.  Patient Active Problem List   Diagnosis Date Noted  . Morbid obesity (HCC) 10/25/2018  . Pneumonia due to COVID-19 virus   . GERD 07/16/2010  . ELECTROCARDIOGRAM, ABNORMAL 07/16/2010    Past Surgical History:  Procedure Laterality Date  . TUBAL LIGATION       OB History   No obstetric history on file.     No family history on file.  Social History   Tobacco Use  . Smoking status: Former Smoker    Types: Cigarettes  . Smokeless tobacco: Never Used  Substance Use Topics  . Alcohol use: Yes    Home Medications Prior to Admission medications   Medication Sig Start Date End Date Taking? Authorizing Provider  albuterol (VENTOLIN HFA) 108 (90 Base) MCG/ACT inhaler Inhale 8 puffs into the lungs 3 (three) times daily. 10/28/18   [name], MD  Multiple Vitamin (MULTIVITAMIN) LIQD Take 30 mLs by mouth daily. [employer]    [provider]    Allergies    Iodine  Review of Systems   Review of Systems Ten systems reviewed and are negative for acute change, except as noted in the HPI.   Physical Exam Updated Vital Signs BP (!) 126/99 (BP Location: Right Arm)   Pulse 97   Temp 98.3 F (36.8 C) (Oral)   Resp 20   Ht 5\' 5"  (1.651 m)   Wt 127 kg   SpO2 98%   BMI 46.59 kg/m   Physical Exam Physical Exam  Nursing note and vitals reviewed. Constitutional: She is oriented to person, place, and time. She appears  well-developed and well-nourished. No distress.  HENT:  Head: Normocephalic and atraumatic.  Eyes: Conjunctivae normal and EOM are normal. Pupils are equal, round, and reactive to light. No scleral icterus.  Neck: Normal range of motion.  Cardiovascular: Normal rate, regular rhythm and normal heart sounds.  Exam reveals no gallop and no friction rub.   No murmur heard. Pulmonary/Chest: Effort normal and breath sounds normal. No respiratory distress.  Abdominal: Soft. Bowel sounds are normal. She exhibits no distension and no mass. There is no tenderness. There is no guarding.  Neurological: She is alert and oriented to person, place, and time.  Skin: Skin is warm and dry. She is not diaphoretic.  3 cm area of fluctuance at the right superior gluteal cleft.  There is about 10 cm of surrounding erythema and induration which is tender to palpation.  ED Results / Procedures / Treatments   Labs (all labs ordered are listed, but only abnormal results are displayed) Labs Reviewed - No data to display  EKG None  Radiology No results found.  Procedures . Incision and Drainage  Date/Time: 11/08/2020 10:28 AM Performed by: 11/10/2020, PA-C Authorized by: Arthor Captain, PA-C   Consent:    Consent obtained:  Verbal   Consent given by:  Patient   Risks discussed:  Bleeding, incomplete  drainage, pain and damage to other organs   Alternatives discussed:  No treatment Universal protocol:    Procedure explained and questions answered to patient or proxy's satisfaction: yes     Relevant documents present and verified: yes     Test results available : yes     Imaging studies available: yes     Required blood products, implants, devices, and special equipment available: yes     Site/side marked: yes     Immediately prior to procedure, a time out was called: yes     Patient identity confirmed:  Verbally with patient Location:    Type:  Pilonidal cyst   Size:  3   Location: Gluteal  cleft. Pre-procedure details:    Skin preparation:  Chlorhexidine Anesthesia:    Anesthesia method:  Local infiltration   Local anesthetic:  Lidocaine 1% WITH epi Procedure type:    Complexity:  Complex Procedure details:    Incision types:  Single straight   Incision depth:  Subcutaneous   Wound management:  Probed and deloculated, irrigated with saline and extensive cleaning   Drainage:  Purulent   Drainage amount:  Copious   Wound treatment:  Wound left open   Packing materials:  1/4 in gauze Post-procedure details:    Procedure completion:  Tolerated with difficulty    Medications Ordered in ED Medications  lidocaine-EPINEPHrine (XYLOCAINE W/EPI) 1 %-1:100000 (with pres) injection 20 mL (3 mLs Intradermal Given by Other 11/08/20 0930)    ED Course  I have reviewed the triage vital signs and the nursing notes.  Pertinent labs & imaging results that were available during my care of the patient were reviewed by me and considered in my medical decision making (see chart for details).    MDM Rules/Calculators/A&P                          45 year old female here with pilonidal abscess.  Was able to tolerate incision and drainage for the procedure.  Noniodinated gauze placed in the abscess cavity is a patient has a history of iodine allergy.  Will discharge with naproxen, she will return in 2 to 3 days for packing removal.  Discussed return precautions Final Clinical Impression(s) / ED Diagnoses Final diagnoses:  Pilonidal abscess    Rx / DC Orders ED Discharge Orders    None       Arthor Captain, PA-C 11/08/20 1029    Cathren Laine, MD 11/09/20 1321

## 2022-06-17 NOTE — Progress Notes (Signed)
New Patient Office Visit  Subjective    Patient ID: Evelyn Joyce, female    DOB: 10-17-1975  Age: 46 y.o. MRN: [id number]  CC:  Chief Complaint  Patient presents with   Establish Care   Shoulder Pain    HPI Evelyn Joyce presents to establish care  PMH-no significant  PSxHx-Tubal ligation 10/28/2000  PSH-no drugs, Cigarettes smoked 1ppd for 15-16 years quit 08/13/2013, liquor occasionally, watches granddaughter 67 year old from time to time at home.  Has a lot of anxiety around health and this is why she has not come to doctors sooner but would like to be checked out  Allergies-iodine-severe itching and rash  FH- Mother-breast cancer 52, diabetes, kidney disease Father passed in 2005/10/28 early death around 60 unsure what caused it-diabetes  No hx of heart attacks or stroke in family  Shoulder pain Swollen the other day. Unsure if she slept on it wrong or happened when moving furniture. Feels like it's possibly out of socket/ "moving". Gradually happened-was sore and got worse.  2 days ago the pain radiated up neck to side of face on left side. Happened 1 time she took a Tylenol and this went away and did not recur. No weakness or sensation differences.  No shooting pain down her arm.  No neck pain or issues with range of motion.  No chest pain.   Side pain  From time to time has pain on her sides bilaterally. Sharp squeeze every now and again. Has been going on for years.  Denies any fevers, diarrhea/constipation, vomiting, dysuria or hematuria. Has regular bowel movements.   Low iron Patient occasionally donates plasma and her plasma donor said her iron was low and that she would need a doctor's appointment before donating again.  Fatigue Patient says that she is tired and that this comes and goes.  Even after good rest she feels sleepy throughout the day.  She also has noticed that her skin has been very dry.  Outpatient Encounter Medications as of 06/19/2022  Medication Sig    diclofenac Sodium (VOLTAREN ARTHRITIS PAIN) 1 % GEL Apply 4 g topically 4 (four) times daily.   naproxen (NAPROSYN) 500 MG tablet Take 1 tablet (500 mg total) by mouth 2 (two) times daily with a meal for 5 days.   [DISCONTINUED] albuterol (VENTOLIN HFA) 108 (90 Base) MCG/ACT inhaler Inhale 8 puffs into the lungs 3 (three) times daily.   [DISCONTINUED] Multiple Vitamin (MULTIVITAMIN) LIQD Take 30 mLs by mouth daily. [employer]   [DISCONTINUED] naproxen (NAPROSYN) 375 MG tablet Take 1 tablet (375 mg total) by mouth 2 (two) times daily with a meal.   No facility-administered encounter medications on file as of 06/19/2022.    No past medical history on file.  Past Surgical History:  Procedure Laterality Date   TUBAL LIGATION  2000/10/28    Family History  Problem Relation Age of Onset   Breast cancer Mother 38   Kidney disease Mother    Early death Father 66   Diabetes Father     Social History   Socioeconomic History   Marital status: Single    Spouse name: Not on file   Number of children: Not on file   Years of education: Not on file   Highest education level: Not on file  Occupational History   Not on file  Tobacco Use   Smoking status: Former    Packs/day: 1.00    Years: 15.00    Total pack years: 15.00  Types: Cigarettes   Smokeless tobacco: Never  Substance and Sexual Activity   Alcohol use: Yes    Comment: Occasionally drinks at social events   Drug use: Not Currently   Sexual activity: Not Currently  Other Topics Concern   Not on file  Social History Narrative   Not on file   Social Determinants of Health   Financial Resource Strain: Unknown (10/27/2018)   Overall Financial Resource Strain (CARDIA)    Difficulty of Paying Living Expenses: Patient refused  Food Insecurity: Unknown (10/27/2018)   Hunger Vital Sign    Worried About Running Out of Food in the Last Year: Patient refused    Ran Out of Food in the Last Year: Patient refused  Transportation Needs:  Unknown (10/27/2018)   PRAPARE - Transportation    Lack of Transportation (Medical): Patient refused    Lack of Transportation (Non-Medical): Patient refused  Physical Activity: Unknown (10/27/2018)   Exercise Vital Sign    Days of Exercise per Week: Patient refused    Minutes of Exercise per Session: Patient refused  Stress: Unknown (10/27/2018)   [employer] of Occupational Health - Occupational Stress Questionnaire    Feeling of Stress : Patient refused  Social Connections: Unknown (10/27/2018)   Social Connection and Isolation Panel [NHANES]    Frequency of Communication with Friends and Family: Patient refused    Frequency of Social Gatherings with Friends and Family: Patient refused    Attends Religious Services: Patient refused    Active Member of Clubs or Organizations: Patient refused    Attends Banker Meetings: Patient refused    Marital Status: Patient refused  Intimate Partner Violence: Unknown (10/27/2018)   Humiliation, Afraid, Rape, and Kick questionnaire    Fear of Current or Ex-Partner: Patient refused    Emotionally Abused: Patient refused    Physically Abused: Patient refused    Sexually Abused: Patient refused      Objective    BP (!) 120/58   Pulse 79   Ht 5\' 5"  (1.651 m)   Wt 264 lb (119.7 kg)   LMP 06/08/2022   SpO2 100%   BMI 43.93 kg/m   General: NAD, awake, alert, responsive to questions Head: Normocephalic atraumatic, oropharynx clear, no neck masses palpated, no adenopathy, full range of motion of neck, negative Lhermitte sign CV: Regular rate and rhythm no murmurs rubs or gallops Respiratory: Clear to ausculation bilaterally, no wheezes rales or crackles, chest rises symmetrically,  no increased work of breathing Abdomen: Soft, non-tender, non-distended, normoactive bowel sounds, no CVA tenderness Extremities: Moves upper and lower extremities freely, no edema in LE Neuro: No focal deficits Skin: No rashes or lesions  visualized Shoulder: Inspection reveals no obvious deformity, atrophy, or asymmetry b/l. No bruising. No swelling Palpation is normal with no TTP over [employer] joint or bicipital groove b/l. Limited ROM in flexion, abduction, internal/external rotation b/l on left side NV intact distally b/l  Assessment & Plan:   Problem List Items Addressed This Visit       Other   Fatigue    Patient relays that she has been having fatigue for a long time alongside dry skin.  Differentials include iron deficiency anemia, hypothyroidism, today although patient denies any snoring/choking symptoms at night, or diabetes. -Check A1c, CBC, BMP, ferritin, TSH -She will need colonoscopy referral at future visit especially if iron deficient      Acute pain of left shoulder    No known trauma to the area however patient says may  be dislocated.  No acute abnormalities on examination except for limited range of motion as this would elicit pain.  This likely rotator cuff tendinopathy -Obtain shoulder x-ray -Short NSAID course of naproxen 500 twice daily for 5 days -Topical Voltaren gel -Consider PT/steroid injection if not improving      Relevant Medications   naproxen (NAPROSYN) 500 MG tablet   diclofenac Sodium (VOLTAREN ARTHRITIS PAIN) 1 % GEL   Other Relevant Orders   DG Shoulder Left (Completed)   Health care maintenance    Patient has not received any healthcare maintenance due to anxiety about doctors appointments and receiving care. -Hepatitis C, HIV -Discussed close follow-up to fill care gaps of Pap smear, mammogram, colonoscopy      Other Visit Diagnoses     Encounter for screening for other metabolic disorders    -  Primary   Relevant Orders   Hepatitis C antibody (reflex, frozen specimen)   HIV antibody (with reflex)   CBC   Basic Metabolic Panel   TSH   HgB A1c (Completed)   Ferritin      No follow-ups on file.   Levin Erp, MD

## 2022-06-19 ENCOUNTER — Encounter: Payer: Self-pay | Admitting: Student

## 2022-06-19 ENCOUNTER — Ambulatory Visit
Admission: RE | Admit: 2022-06-19 | Discharge: 2022-06-19 | Disposition: A | Discharge status: Home / Self Care | Payer: Medicaid Other | Source: Ambulatory Visit | Attending: Family Medicine | Admitting: Family Medicine

## 2022-06-19 ENCOUNTER — Ambulatory Visit (INDEPENDENT_AMBULATORY_CARE_PROVIDER_SITE_OTHER): Payer: Medicaid Other | Admitting: Student

## 2022-06-19 VITALS — BP 120/58 | HR 79 | Ht 65.0 in | Wt 264.0 lb

## 2022-06-19 DIAGNOSIS — M25512 Pain in left shoulder: Secondary | ICD-10-CM

## 2022-06-19 DIAGNOSIS — D509 Iron deficiency anemia, unspecified: Secondary | ICD-10-CM | POA: Diagnosis not present

## 2022-06-19 DIAGNOSIS — Z0000 Encounter for general adult medical examination without abnormal findings: Secondary | ICD-10-CM

## 2022-06-19 DIAGNOSIS — Z13228 Encounter for screening for other metabolic disorders: Secondary | ICD-10-CM

## 2022-06-19 DIAGNOSIS — R5383 Other fatigue: Secondary | ICD-10-CM

## 2022-06-19 LAB — POCT GLYCOSYLATED HEMOGLOBIN (HGB A1C): Hemoglobin A1C: 5.6 % (ref 4.0–5.6)

## 2022-06-19 MED ORDER — DICLOFENAC SODIUM 1 % EX GEL: 4.0000 g | Freq: Four times a day (QID) | CUTANEOUS | 0 refills | Status: DC

## 2022-06-19 MED ORDER — NAPROXEN 500 MG PO TABS: 500.0000 mg | ORAL_TABLET | Freq: Two times a day (BID) | ORAL | 0 refills | Status: AC

## 2022-06-19 NOTE — Patient Instructions (Signed)
It was great to see you! Thank you for allowing me to participate in your care!   Our plans for today:  -Doing some screening labs today I will message you on MyChart or call you about these results -I am ordering a shoulder x-ray for you to check if there is any dislocation but I think this is most likely related to a rotator cuff tendinopathy.  Prescribe you NSAIDs to take for 5 days please take this with meals and I am also prescribing a topical medicine for this -You will need to return for Pap smear/other health maintenance updates but we can follow-up frequently with these done!  Take care and seek immediate care sooner if you develop any concerns.  Levin Erp, MD

## 2022-06-19 NOTE — Assessment & Plan Note (Signed)
Patient relays that she has been having fatigue for a long time alongside dry skin.  Differentials include iron deficiency anemia, hypothyroidism, today although patient denies any snoring/choking symptoms at night, or diabetes. -Check A1c, CBC, BMP, ferritin, TSH -She will need colonoscopy referral at future visit especially if iron deficient

## 2022-06-19 NOTE — Assessment & Plan Note (Signed)
Patient has not received any healthcare maintenance due to anxiety about doctors appointments and receiving care. -Hepatitis C, HIV -Discussed close follow-up to fill care gaps of Pap smear, mammogram, colonoscopy

## 2022-06-19 NOTE — Assessment & Plan Note (Signed)
No known trauma to the area however patient says may be dislocated.  No acute abnormalities on examination except for limited range of motion as this would elicit pain.  This likely rotator cuff tendinopathy -Obtain shoulder x-ray -Short NSAID course of naproxen 500 twice daily for 5 days -Topical Voltaren gel -Consider PT/steroid injection if not improving

## 2022-06-20 ENCOUNTER — Encounter: Payer: Self-pay | Admitting: Student

## 2022-06-20 LAB — CBC
Hematocrit: 30.2 % — ABNORMAL LOW (ref 34.0–46.6)
Hemoglobin: 9 g/dL — ABNORMAL LOW (ref 11.1–15.9)
MCH: 23.6 pg — ABNORMAL LOW (ref 26.6–33.0)
MCHC: 29.8 g/dL — ABNORMAL LOW (ref 31.5–35.7)
MCV: 79 fL (ref 79–97)
Platelets: 344 10*3/uL (ref 150–450)
RBC: 3.81 x10E6/uL (ref 3.77–5.28)
RDW: 16.8 % — ABNORMAL HIGH (ref 11.7–15.4)
WBC: 5.2 10*3/uL (ref 3.4–10.8)

## 2022-06-20 LAB — BASIC METABOLIC PANEL
BUN/Creatinine Ratio: 7 — ABNORMAL LOW (ref 9–23)
BUN: 7 mg/dL (ref 6–24)
CO2: 21 mmol/L (ref 20–29)
Calcium: 9.8 mg/dL (ref 8.7–10.2)
Chloride: 103 mmol/L (ref 96–106)
Creatinine, Ser: 1.06 mg/dL — ABNORMAL HIGH (ref 0.57–1.00)
Glucose: 123 mg/dL — ABNORMAL HIGH (ref 70–99)
Potassium: 4.2 mmol/L (ref 3.5–5.2)
Sodium: 139 mmol/L (ref 134–144)
eGFR: 66 mL/min/{1.73_m2} (ref >59)

## 2022-06-20 LAB — HIV ANTIBODY (ROUTINE TESTING W REFLEX): HIV Screen 4th Generation wRfx: NONREACTIVE

## 2022-06-20 LAB — HCV AB W REFLEX TO QUANT PCR: HCV Ab: NONREACTIVE

## 2022-06-20 LAB — FERRITIN: Ferritin: 8 ng/mL — ABNORMAL LOW (ref 15–150)

## 2022-06-20 LAB — TSH: TSH: 2.62 u[IU]/mL (ref 0.450–4.500)

## 2022-06-20 LAB — HCV INTERPRETATION

## 2022-06-20 MED ORDER — FERROUS SULFATE 325 (65 FE) MG PO TBEC: 325.0000 mg | DELAYED_RELEASE_TABLET | ORAL | 0 refills | Status: DC

## 2022-06-20 NOTE — Addendum Note (Signed)
Addended by: Levin Erp on: 06/20/2022 01:58 PM   Modules accepted: Orders

## 2022-06-23 ENCOUNTER — Telehealth: Payer: Self-pay | Admitting: Student

## 2022-06-23 DIAGNOSIS — D509 Iron deficiency anemia, unspecified: Secondary | ICD-10-CM

## 2022-06-23 NOTE — Telephone Encounter (Signed)
Called patient and confirmed DOB.  Discussed overall lab results are within normal limits except for anemia with hemoglobin of 9 and very low ferritin.  I discussed I have sent in ferrous sulfate for her to take every other day.  I also discussed to hold off on taking her naproxen tablet for shoulder pain given her anemia.  I discussed return/ED precautions for anemia.  I also discussed recommendation for colonoscopy and patient was amenable for this.  I have placed ferrous sulfate order and I will place colonoscopy ordered now.  Discussed she will need to schedule an appointment for Pap smear evaluation as well.

## 2022-06-29 DIAGNOSIS — M9904 Segmental and somatic dysfunction of sacral region: Secondary | ICD-10-CM | POA: Diagnosis not present

## 2022-06-29 DIAGNOSIS — M9905 Segmental and somatic dysfunction of pelvic region: Secondary | ICD-10-CM | POA: Diagnosis not present

## 2022-06-29 DIAGNOSIS — M9903 Segmental and somatic dysfunction of lumbar region: Secondary | ICD-10-CM | POA: Diagnosis not present

## 2022-06-29 DIAGNOSIS — M5386 Other specified dorsopathies, lumbar region: Secondary | ICD-10-CM | POA: Diagnosis not present

## 2022-07-01 DIAGNOSIS — M9903 Segmental and somatic dysfunction of lumbar region: Secondary | ICD-10-CM | POA: Diagnosis not present

## 2022-07-01 DIAGNOSIS — M9904 Segmental and somatic dysfunction of sacral region: Secondary | ICD-10-CM | POA: Diagnosis not present

## 2022-07-01 DIAGNOSIS — M9905 Segmental and somatic dysfunction of pelvic region: Secondary | ICD-10-CM | POA: Diagnosis not present

## 2022-07-01 DIAGNOSIS — M5386 Other specified dorsopathies, lumbar region: Secondary | ICD-10-CM | POA: Diagnosis not present

## 2022-07-02 ENCOUNTER — Ambulatory Visit: Payer: Medicaid Other | Admitting: Student

## 2022-07-03 DIAGNOSIS — L600 Ingrowing nail: Secondary | ICD-10-CM | POA: Diagnosis not present

## 2022-07-03 DIAGNOSIS — M792 Neuralgia and neuritis, unspecified: Secondary | ICD-10-CM | POA: Diagnosis not present

## 2022-07-08 DIAGNOSIS — M5386 Other specified dorsopathies, lumbar region: Secondary | ICD-10-CM | POA: Diagnosis not present

## 2022-07-08 DIAGNOSIS — M9904 Segmental and somatic dysfunction of sacral region: Secondary | ICD-10-CM | POA: Diagnosis not present

## 2022-07-08 DIAGNOSIS — M9903 Segmental and somatic dysfunction of lumbar region: Secondary | ICD-10-CM | POA: Diagnosis not present

## 2022-07-08 DIAGNOSIS — M9905 Segmental and somatic dysfunction of pelvic region: Secondary | ICD-10-CM | POA: Diagnosis not present

## 2022-07-10 ENCOUNTER — Ambulatory Visit: Payer: Medicaid Other | Admitting: Student

## 2022-07-10 ENCOUNTER — Other Ambulatory Visit (HOSPITAL_COMMUNITY)
Admission: RE | Admit: 2022-07-10 | Discharge: 2022-07-10 | Disposition: A | Discharge status: Home / Self Care | Payer: Medicaid Other | Source: Ambulatory Visit | Attending: Family Medicine | Admitting: Family Medicine

## 2022-07-10 ENCOUNTER — Encounter: Payer: Self-pay | Admitting: Student

## 2022-07-10 VITALS — BP 111/79 | HR 84 | Ht 65.0 in | Wt 267.4 lb

## 2022-07-10 DIAGNOSIS — Z124 Encounter for screening for malignant neoplasm of cervix: Secondary | ICD-10-CM | POA: Diagnosis not present

## 2022-07-10 DIAGNOSIS — M25512 Pain in left shoulder: Secondary | ICD-10-CM

## 2022-07-10 DIAGNOSIS — Z23 Encounter for immunization: Secondary | ICD-10-CM

## 2022-07-10 NOTE — Progress Notes (Signed)
    SUBJECTIVE:   CHIEF COMPLAINT / HPI: Obtain pap smear  Pap Smear: Patient is a 46 y.o. female presenting for pap smear.  She states she does not have any discharge, odor or vaginal symptoms.  She is interested in screening for sexually transmitted infections today.  Shoulder pain: Left shoulder continues to give patient trouble.  Does have tenderness to palpation around anterior shoulder.  Has been using Voltaren gel which has been helping her.  Went to the chiropractor twice and feels worse after this.  PERTINENT  PMH / PSH: None relevant  OBJECTIVE:   BP 111/79   Pulse 84   Ht 5\' 5"  (1.651 m)   Wt 267 lb 6.4 oz (121.3 kg)   LMP 07/02/2022   SpO2 100%   BMI 44.50 kg/m    General: NAD, pleasant, able to participate in exam Respiratory: Normal effort, no obvious respiratory distress Shoulder: Mild tenderness palpation anterior left shoulder, grip strength 5 out of 5 bilaterally, 2+ radial pulses bilaterally, sensation intact bilaterally Pelvic: VULVA: normal appearing vulva with no masses, tenderness or lesions, VAGINA: Normal appearing vagina with normal color, no lesions, with scant discharge present, CERVIX: No lesions, scant discharge present. Pap smear performed with cytobrush and spatula  Chaperone 07/04/2022 CMA present for pelvic exam  ASSESSMENT/PLAN:   46 y.o. female here for pap smear.  Physical exam significant for scant discharge.  Patient is not interested in STI screening.   Plan: -F/u pap smear results -Gonnorhea, chlamydia  -Follow-up as needed  Acute pain of left shoulder Left shoulder x-ray unremarkable.  Discussed this is most likely rotator cuff injury that is chronic and to consider physical therapy.  Patient would like to defer physical therapy until next visit. -Consider physical therapy referral at nxt visit -Voltaren gel   Patient given flu vaccine and Tdap in office today  55, MD [facility]

## 2022-07-10 NOTE — Patient Instructions (Signed)
It was great to see you! Thank you for allowing me to participate in your care!   Our plans for today:  - We got your pap smear and g/c test with this today, I will let you know what these show  Take care and seek immediate care sooner if you develop any concerns.  Levin Erp, MD

## 2022-07-10 NOTE — Assessment & Plan Note (Signed)
Left shoulder x-ray unremarkable.  Discussed this is most likely rotator cuff injury that is chronic and to consider physical therapy.  Patient would like to defer physical therapy until next visit. -Consider physical therapy referral at nxt visit -Voltaren gel

## 2022-07-15 LAB — CYTOLOGY - PAP
Adequacy: ABSENT
Chlamydia: NEGATIVE
Comment: NEGATIVE
Comment: NEGATIVE
Comment: NEGATIVE
Comment: NORMAL
Diagnosis: NEGATIVE
Diagnosis: REACTIVE
High risk HPV: NEGATIVE
Neisseria Gonorrhea: NEGATIVE
Trichomonas: NEGATIVE

## 2022-07-17 DIAGNOSIS — L600 Ingrowing nail: Secondary | ICD-10-CM | POA: Diagnosis not present

## 2022-07-17 DIAGNOSIS — M792 Neuralgia and neuritis, unspecified: Secondary | ICD-10-CM | POA: Diagnosis not present

## 2022-07-17 HISTORY — PX: OTHER SURGICAL HISTORY: SHX169

## 2022-07-20 DIAGNOSIS — M9904 Segmental and somatic dysfunction of sacral region: Secondary | ICD-10-CM | POA: Diagnosis not present

## 2022-07-20 DIAGNOSIS — M9903 Segmental and somatic dysfunction of lumbar region: Secondary | ICD-10-CM | POA: Diagnosis not present

## 2022-07-20 DIAGNOSIS — M5386 Other specified dorsopathies, lumbar region: Secondary | ICD-10-CM | POA: Diagnosis not present

## 2022-07-20 DIAGNOSIS — M9905 Segmental and somatic dysfunction of pelvic region: Secondary | ICD-10-CM | POA: Diagnosis not present

## 2022-07-24 ENCOUNTER — Encounter: Payer: Self-pay | Admitting: Internal Medicine

## 2022-07-28 DIAGNOSIS — M9904 Segmental and somatic dysfunction of sacral region: Secondary | ICD-10-CM | POA: Diagnosis not present

## 2022-07-28 DIAGNOSIS — M9905 Segmental and somatic dysfunction of pelvic region: Secondary | ICD-10-CM | POA: Diagnosis not present

## 2022-07-28 DIAGNOSIS — M9903 Segmental and somatic dysfunction of lumbar region: Secondary | ICD-10-CM | POA: Diagnosis not present

## 2022-07-28 DIAGNOSIS — M5386 Other specified dorsopathies, lumbar region: Secondary | ICD-10-CM | POA: Diagnosis not present

## 2022-08-05 DIAGNOSIS — M9905 Segmental and somatic dysfunction of pelvic region: Secondary | ICD-10-CM | POA: Diagnosis not present

## 2022-08-05 DIAGNOSIS — M9904 Segmental and somatic dysfunction of sacral region: Secondary | ICD-10-CM | POA: Diagnosis not present

## 2022-08-05 DIAGNOSIS — M9903 Segmental and somatic dysfunction of lumbar region: Secondary | ICD-10-CM | POA: Diagnosis not present

## 2022-08-05 DIAGNOSIS — M5386 Other specified dorsopathies, lumbar region: Secondary | ICD-10-CM | POA: Diagnosis not present

## 2022-08-23 NOTE — Progress Notes (Unsigned)
    SUBJECTIVE:   CHIEF COMPLAINT / HPI: Left Shoulder Pain  Feels like it is dislocated-can't lay on it or turn certain ways. Pain a little better but not much since the last time we spoke about. Since November has been ahving this pain.  Went to chiropractor who said if something going. 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  PERTINENT  PMH / PSH: ***  OBJECTIVE:   There were no vitals taken for this visit.  ***  ASSESSMENT/PLAN:   No problem-specific Assessment & Plan notes found for this encounter.     Gerrit Heck, MD [place]

## 2022-08-24 ENCOUNTER — Ambulatory Visit (INDEPENDENT_AMBULATORY_CARE_PROVIDER_SITE_OTHER): Payer: Medicaid Other | Admitting: Student

## 2022-08-24 ENCOUNTER — Encounter: Payer: Self-pay | Admitting: Student

## 2022-08-24 VITALS — BP 122/76 | HR 78 | Ht 65.0 in | Wt 267.2 lb

## 2022-08-24 DIAGNOSIS — Z8639 Personal history of other endocrine, nutritional and metabolic disease: Secondary | ICD-10-CM | POA: Diagnosis not present

## 2022-08-24 DIAGNOSIS — M25512 Pain in left shoulder: Secondary | ICD-10-CM

## 2022-08-24 NOTE — Patient Instructions (Signed)
It was great to see you! Thank you for allowing me to participate in your care!   I recommend that you always bring your medications to each appointment as this makes it easy to ensure we are on the correct medications and helps [place] not miss when refills are needed.  Our plans for today:  - I am referring you to sports medicine for further work uo - We can check a vitamin D level today  We are checking some labs today, I will call you if they are abnormal will send you a MyChart message or a letter if they are normal.  If you do not hear about your labs in the next 2 weeks please let us know.  Take care and seek immediate care sooner if you develop any concerns. Please remember to show up 15 minutes before your scheduled appointment time!  Gerrit Heck, MD [place]

## 2022-08-25 ENCOUNTER — Encounter: Payer: Self-pay | Admitting: Student

## 2022-08-25 LAB — VITAMIN D 25 HYDROXY (VIT D DEFICIENCY, FRACTURES): Vit D, 25-Hydroxy: 16.9 ng/mL — ABNORMAL LOW (ref 30.0–100.0)

## 2022-08-25 NOTE — Assessment & Plan Note (Signed)
Patient continuing to have left shoulder pain. Possible rotator cuff injury? Has gotten XR which was negative for any bony abnormalities. Discussed sports med referral for possible ultrasound and injection for pain control which she was amenable to. -Sports med referral

## 2022-08-27 ENCOUNTER — Encounter: Payer: Self-pay | Admitting: Internal Medicine

## 2022-08-27 ENCOUNTER — Ambulatory Visit: Payer: Medicaid Other | Admitting: Internal Medicine

## 2022-08-27 VITALS — BP 120/60 | HR 72 | Ht 65.0 in | Wt 266.0 lb

## 2022-08-27 DIAGNOSIS — D509 Iron deficiency anemia, unspecified: Secondary | ICD-10-CM | POA: Diagnosis not present

## 2022-08-27 DIAGNOSIS — Z1211 Encounter for screening for malignant neoplasm of colon: Secondary | ICD-10-CM

## 2022-08-27 MED ORDER — PLENVU 140 G PO SOLR: 1.0000 | Freq: Once | ORAL | 0 refills | Status: AC

## 2022-08-27 NOTE — Progress Notes (Signed)
Chief Complaint: IDA  HPI : 47 year old female with history of IDA presents with IDA  She has had IDA for years. She recently started oral iron supplements after she was told that her hemoglobin levels have dropped. She has heavy menstrual periods. She always feels drained after her periods. Denies hematochezia or melena. Denies N&V, dysphagia, chest burning, regurgitation, weight loss, or diarrhea. She will have bilateral ab pain at times that started last month. This ab pain has gotten better since then. Tylenol will help with the ab pain. She had constipation when she was younger, but does not have any constipation currently. She figured out that she has lactose intolerance. Denies family history of GI cancer. Denies prior EGD or colonoscopy. Denies blood thinners. She took one type of NSAID for two days for shoulder pain, but then was told to stop after her blood counts dropped.  History reviewed. No pertinent past medical history.  Past Surgical History:  Procedure Laterality Date   TUBAL LIGATION  2002   Family History  Problem Relation Age of Onset   Breast cancer Mother 86   Kidney disease Mother    Early death Father 28   Diabetes Father    Social History   Tobacco Use   Smoking status: Former    Packs/day: 1.00    Years: 15.00    Total pack years: 15.00    Types: Cigarettes   Smokeless tobacco: Never  Substance Use Topics   Alcohol use: Yes    Comment: Occasionally drinks at social events   Drug use: Not Currently   Current Outpatient Medications  Medication Sig Dispense Refill   diclofenac Sodium (VOLTAREN ARTHRITIS PAIN) 1 % GEL Apply 4 g topically 4 (four) times daily. 50 g 0   ferrous sulfate 325 (65 FE) MG EC tablet Take 1 tablet (325 mg total) by mouth every other day. 45 tablet 0   No current facility-administered medications for this visit.   Allergies  Allergen Reactions   Iodine Itching and Rash    LOCALIZED REACTION    Review of Systems: All  systems reviewed and negative except where noted in HPI.   Physical Exam: BP 120/60   Pulse 72   Ht 5' 5"$  (1.651 m)   Wt 266 lb (120.7 kg)   LMP 08/16/2022   BMI 44.26 kg/m  Constitutional: Pleasant,well-developed, female in no acute distress. HEENT: Normocephalic and atraumatic. Conjunctivae are normal. No scleral icterus. Cardiovascular: Normal rate, regular rhythm.  Pulmonary/chest: Effort normal and breath sounds normal. No wheezing, rales or rhonchi. Abdominal: Soft, nondistended, nontender. Bowel sounds active throughout. There are no masses palpable. No hepatomegaly. Extremities: No edema Neurological: Alert and oriented to person place and time. Skin: Skin is warm and dry. No rashes noted. Psychiatric: Normal mood and affect. Behavior is normal.  Labs 06/2022: Low ferritin of 8. TSH nml. CBC with low Hb of 9. BMP with mildly elevated Cr of 1.06. HIV NR. HCV Ab NR. HbA1C normal. Low vit D level of 16.9.   ASSESSMENT AND PLAN: IDA Colon cancer screening Patient presents with IDA that was noted to be worsening on her most recent labs. Patient does have heavy menstrual periods, which may account for her IDA, but will plan for EGD and colonoscopy to rule out GI sources of blood loss. Patient is due for colon cancer screening due to her age. - Continue oral iron supplements - EGD/colonoscopy [facility]  Christia Reading, MD  I spent 52 minutes of time, including  in depth chart review, independent review of results as outlined above, communicating results with the patient directly, face-to-face time with the patient, coordinating care, ordering studies and medications as appropriate, and documentation.

## 2022-08-27 NOTE — Patient Instructions (Signed)
_______________________________________________________  If your blood pressure at your visit was 140/90 or greater, please contact your primary care physician to follow up on this.  _______________________________________________________  If you are age 46 or older, your body mass index should be between 23-30. Your Body mass index is 44.26 kg/m. If this is out of the aforementioned range listed, please consider follow up with your Primary Care Provider.  If you are age 34 or younger, your body mass index should be between 19-25. Your Body mass index is 44.26 kg/m. If this is out of the aformentioned range listed, please consider follow up with your Primary Care Provider.   ________________________________________________________  The [facility] providers would like to encourage you to use [facility] to communicate with providers for non-urgent requests or questions.  Due to long hold times on the telephone, sending your provider a message by [facility] may be a faster and more efficient way to get a response.  Please allow 48 business hours for a response.  Please remember that this is for non-urgent requests.  _______________________________________________________  Dennis Bast have been scheduled for an endoscopy and colonoscopy. Please follow the written instructions given to you at your visit today. Please pick up your prep supplies at the pharmacy within the next 1-3 days. If you use inhalers (even only as needed), please bring them with you on the day of your procedure.

## 2022-08-28 ENCOUNTER — Ambulatory Visit: Payer: Medicaid Other | Admitting: Family Medicine

## 2022-08-28 ENCOUNTER — Ambulatory Visit: Payer: Self-pay

## 2022-08-28 ENCOUNTER — Encounter: Payer: Self-pay | Admitting: Family Medicine

## 2022-08-28 VITALS — BP 130/62 | Ht 65.0 in | Wt 266.0 lb

## 2022-08-28 DIAGNOSIS — M25512 Pain in left shoulder: Secondary | ICD-10-CM

## 2022-08-28 DIAGNOSIS — S4352XA Sprain of left acromioclavicular joint, initial encounter: Secondary | ICD-10-CM

## 2022-08-28 MED ORDER — METHYLPREDNISOLONE ACETATE 40 MG/ML IJ SUSP
40.0000 mg | Freq: Once | INTRAMUSCULAR | Status: AC
  Administered 2022-08-28: 40 mg via INTRA_ARTICULAR

## 2022-08-28 NOTE — Progress Notes (Signed)
[facility]: Attending Note: I have examined the patient, reviewed the chart, discussed the assessment and plan with the Sports Medicine Fellow. I agree with assessment and treatment plan as detailed in the [place] note. Shoulder pain with most of her symptoms seeming to be localized to the acromioclavicular joint.  We injected that today.  I think she is we also injected the subacromial bursa because she has some significant stiffness with external rotation on that side.  Home exercise program given.  He will follow-up with some in about 3 weeks.  Hopefully she will have increased range of motion in external rotation.  If not would consider glenohumeral joint injection as I am afraid she started to get adhesive capsulitis.

## 2022-08-28 NOTE — Progress Notes (Signed)
New Patient Office Visit  Subjective   Patient ID: Evelyn Joyce, female    DOB: 05-23-1976  Age: 47 y.o. MRN: [id number]  Left shoulder pain.  Evelyn Joyce is here today with chief complaint of left shoulder pain that is been bothering her for the past couple of months.  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  She was evaluated for shoulder pain a couple of months ago with a negative x-rays.  She reports she feels like her shoulder is out of place or broken.  X-rays did not reveal any fracture.  Most of her pain is located in the top of her shoulder and some radiates up into her neck.  She reports she is able to work however she has some limited range of motion secondary to her pain.  She denies any radiation of her pain down into her arm or any numbness and tingling.  She has tried ice, ibuprofen and Tylenol with only temporary relief.   ROS as listed above in HPI    Objective:     BP 130/62   Ht 5' 5"$  (1.651 m)   Wt 266 lb (120.7 kg)   LMP 08/16/2022   BMI 44.26 kg/m  Physical Exam Vitals reviewed.  Constitutional:      General: She is not in acute distress.    Appearance: Normal appearance. She is obese. She is not ill-appearing or toxic-appearing.  Pulmonary:     Effort: Pulmonary effort is normal.  Neurological:     Mental Status: She is alert.  Psychiatric:        Mood and Affect: Mood normal.        Behavior: Behavior normal.        Thought Content: Thought content normal.        Judgment: Judgment normal.   Left shoulder: No obvious deformity or asymmetry.  No ecchymosis or edema.  She does have some quite distinct tenderness to palpation over her Evelyn joint.  Decreased range of motion with forward flexion to about 90 degrees, abduction to approximately 90 degrees, external rotation about 35 degrees.  Strength 4/5 resisted forward flexion.  5/5 internal and external  rotation.  Negative cross body testing.  Positive empty can testing for pain.  Negative Hawkins.  Negative Neer.  Full range of motion with passive testing.  Grip strength 5/5.  Radial pulse 2+ bilaterally  Limited ultrasound: Left shoulder Biceps tendon was visualized in both short and long axis with no obvious hypoechoic changes to the tendon Acromioclavicular joint was visualized.  There is evidence of joint capsule distention with hypoechoic fluid. Impression: Evelyn Joyce    Assessment & Plan:   Problem List Items Addressed This Visit       Other   Acute pain of left shoulder - Primary    Patient likely suffering from Evelyn Joyce joint Joyce and left shoulder bursitis.  Her both her Evelyn joint and subacromial space were injected today, detailed below.  Patient tolerated these injections well.  She was also given some home rotator cuff exercises to begin once her pain has improved.  She reported improvement of pain and increased range of motion immediately after the injections.  Will follow-up with her in 6 weeks. If these injections are very short-lived she has no improvement in her symptoms at her follow-up visit, can consider further imaging such as MRI.  Relevant Orders   [place] COMPLETE JOINT SPACE STRUCTURE UP LEFT   After informed written consent timeout was performed, patient was seated in chair in exam room. Left shoulder was prepped with alcohol swab and utilizing lateral approach with ultrasound guidance, patient's right subacromial space was injected with 4:1 lidocaine: depomedrol. Patient tolerated the procedure well without immediate complications.  US-guided Evelyn Joyce Joint injection, left shoulder After discussion on risks/benefits/indications, informed verbal consent was obtained. A timeout was then performed. The patient was seated in examination room. The area overlying the Evelyn Joyce joint of the shoulder was prepped with Betadine and alcohol swab then utilizing ultrasound guidance,  patient's Evelyn joint was injected using a 25G, 1.5" needle with 0.5:1.64m lidocaine:depomedrol of injectate via an in plane approach. Visualization of injectate flow was noted under ultrasound guidance. Patient tolerated the procedure well without immediate complications.   Return in about 6 weeks (around 10/09/2022).    MElmore Guise DO

## 2022-08-28 NOTE — Assessment & Plan Note (Addendum)
Patient likely suffering from [facility] joint sprain and left shoulder bursitis.  Her both her AC joint and subacromial space were injected today, detailed below.  Patient tolerated these injections well.  She was also given some home rotator cuff exercises to begin once her pain has improved.  She reported improvement of pain and increased range of motion immediately after the injections.  Will follow-up with her in 6 weeks. If these injections are very short-lived she has no improvement in her symptoms at her follow-up visit, can consider further imaging such as MRI.

## 2022-09-04 ENCOUNTER — Telehealth: Payer: Medicaid Other | Admitting: Physician Assistant

## 2022-09-04 ENCOUNTER — Telehealth: Payer: Medicaid Other

## 2022-09-04 DIAGNOSIS — R0781 Pleurodynia: Secondary | ICD-10-CM | POA: Diagnosis not present

## 2022-09-04 MED ORDER — MELOXICAM 15 MG PO TABS: 15.0000 mg | ORAL_TABLET | Freq: Every day | ORAL | 0 refills | Status: DC

## 2022-09-04 NOTE — Patient Instructions (Signed)
Evelyn Joyce, thank you for joining Mar Daring, PA-C for today's virtual visit.  While this provider is not your primary care provider (PCP), if your PCP is located in our provider database this encounter information will be shared with them immediately following your visit.   [facility] account gives you access to today's visit and all your visits, tests, and labs performed at [facility] " click here if you don't have a [facility] account or go to [email or web address]  Consent: (Patient) Evelyn Joyce provided verbal consent for this virtual visit at the beginning of the encounter.  Current Medications:  Current Outpatient Medications:    ferrous sulfate 325 (65 FE) MG EC tablet, Take 1 tablet (325 mg total) by mouth every other day., Disp: 45 tablet, Rfl: 0   meloxicam (MOBIC) 15 MG tablet, Take 1 tablet (15 mg total) by mouth daily., Disp: 30 tablet, Rfl: 0   Medications ordered in this encounter:  Meds ordered this encounter  Medications   meloxicam (MOBIC) 15 MG tablet    Sig: Take 1 tablet (15 mg total) by mouth daily.    Dispense:  30 tablet    Refill:  0    Order Specific Question:   Supervising Provider    Answer:   Evelyn Joyce [id number]     *If you need refills on other medications prior to your next appointment, please contact your pharmacy*  Follow-Up: Call back or seek an in-person evaluation if the symptoms worsen or if the condition fails to improve as anticipated.  [place] [phone]  Other Instructions Costochondritis  Costochondritis is inflammation of the tissue (cartilage) that connects the ribs to the breastbone (sternum). This causes pain in the front of the chest. The pain often starts slowly and involves more than one rib. What are the causes? This condition results from stress on the cartilage where your ribs attach to your sternum. The exact cause of this stress is not always  known. The cause may be: Chest injury. Exercise or activity. This may include lifting. Severe coughing. What increases the risk? You are more likely to develop this condition if: You are female. You are 45-64 years old. You just started a new exercise or work activity. You have low levels of vitamin D. You have a condition that makes you cough often. What are the signs or symptoms? The main symptom of this condition is chest pain. The pain: Starts slowly and can be sharp or dull. Gets worse with deep breathing, coughing, or exercise. Gets better with rest. May be worse when you press on the affected area of your ribs and sternum. How is this diagnosed? This condition is diagnosed based on your symptoms, your medical history, and a physical exam. Your health care provider will check for pain when pressing on your sternum. You may also have tests to rule out other causes of chest pain. These may include: A chest X-ray. This may be done to check for lung problems. An electrocardiogram (ECG). This may be done to see if you have a heart problem that could be causing the pain. An imaging scan. This may be done to rule out a broken bone (fracture) in your chest or ribcage. How is this treated? This condition may go away on its own over time. Your health care provider may prescribe an NSAID, such as ibuprofen, to reduce pain and inflammation. Treatment may also include: Resting and avoiding activities that make pain worse.  Putting heat or ice on the area to reduce pain and inflammation. Doing exercises to stretch your chest muscles. If these treatments do not help, your health care provider may inject a numbing medicine at the spot where the sternum and rib connect. This can help relieve the pain. Follow these instructions at home: Managing pain, stiffness, and swelling     If directed, put ice on the painful area. To do this: Put ice in a plastic bag. Place a towel between your skin and  the bag. Leave the ice on for 20 minutes, 2-3 times a day. If directed, apply heat to the affected area as often as told by your health care provider. Use the heat source that your health care provider recommends, such as a moist heat pack or a heating pad. Place a towel between your skin and the heat source. Leave the heat on for 20-30 minutes. If your skin turns bright red, remove the heat or ice right away to prevent skin damage. The risk of skin damage is higher if you cannot feel pain, heat, or cold. Activity Rest as told by your health care provider. Avoid activities that make pain worse. This includes activities that use the muscles in your chest, abdomen, and sides. You may have to avoid lifting. Ask your health care provider how much you can safely lift. Return to your normal activities as told by your health care provider. Ask your health care provider what activities are safe for you. General instructions Take over-the-counter and prescription medicines only as told by your health care provider. Contact a health care provider if: You have chills or a fever. Your pain does not go away, or it gets worse. You have a cough that does not go away. Get help right away if: You feel short of breath. You have severe chest pain that does not get better with medicines, heat, or ice. These symptoms may be an emergency. Get help right away. Call 911. Do not wait to see if the symptoms will go away. Do not drive yourself to the hospital. This information is not intended to replace advice given to you by your health care provider. Make sure you discuss any questions you have with your health care provider. Document Revised: 01/15/2022 Document Reviewed: 01/15/2022 Elsevier Patient Education  [place].    If you have been instructed to have an in-person evaluation today at a local Urgent Care facility, please use the link below. It will take you to a list of all of our available [facility], including address, phone number and hours of operation. Please do not delay care.  [facility]  If you or a family member do not have a primary care provider, use the link below to schedule a visit and establish care. When you choose a [facility] primary care physician or advanced practice provider, you gain a long-term partner in health. Find a Primary Care Provider  Learn more about [facility]'s in-office and virtual care options: [facility]

## 2022-09-04 NOTE — Progress Notes (Signed)
Virtual Visit Consent   Evelyn Joyce, you are scheduled for a virtual visit with a [facility] provider today. Just as with appointments in the office, your consent must be obtained to participate. Your consent will be active for this visit and any virtual visit you may have with one of our providers in the next 365 days. If you have a MyChart account, a copy of this consent can be sent to you electronically.  As this is a virtual visit, video technology does not allow for your provider to perform a traditional examination. This may limit your provider's ability to fully assess your condition. If your provider identifies any concerns that need to be evaluated in person or the need to arrange testing (such as labs, EKG, etc.), we will make arrangements to do so. Although advances in technology are sophisticated, we cannot ensure that it will always work on either your end or our end. If the connection with a video visit is poor, the visit may have to be switched to a telephone visit. With either a video or telephone visit, we are not always able to ensure that we have a secure connection.  By engaging in this virtual visit, you consent to the provision of healthcare and authorize for your insurance to be billed (if applicable) for the services provided during this visit. Depending on your insurance coverage, you may receive a charge related to this service.  I need to obtain your verbal consent now. Are you willing to proceed with your visit today? Evelyn Joyce has provided verbal consent on 09/04/2022 for a virtual visit (video or telephone). Mar Daring, PA-C  Date: 09/04/2022 4:26 PM  Virtual Visit via Video Note   IMar Daring, connected with  Evelyn Joyce  ([id number], 19-Nov-1975) on 09/04/22 at  4:00 PM EST by a video-enabled telemedicine application and verified that I am speaking with the correct person using two identifiers.  Location: Patient: Virtual Visit Location  Patient: Home Provider: Virtual Visit Location Provider: Home Office   I discussed the limitations of evaluation and management by telemedicine and the availability of in person appointments. The patient expressed understanding and agreed to proceed.    History of Present Illness: Evelyn Joyce is a 47 y.o. who identifies as a female who was assigned female at birth, and is being seen today for having severe left sided pain. Started last night, awoke her from sleep. Denies any urinary changes, bowel changes, URI symptoms, coughing, hematuria, melena/hematochezia, fevers, chills, nausea, or vomiting. Does report she can feel it when she takes a deep breath.  Has been using Tylenol with minimal improvements.   Has recently been dealing with a left shoulder/rotator cuff injury with some shoulder bursitis and possible adhesive capsulitis. Was seen on 08/28/22 and given intraarticular joint injections.    Problems:  Patient Active Problem List   Diagnosis Date Noted   Fatigue 06/19/2022   Acute pain of left shoulder 06/19/2022   Health care maintenance 06/19/2022   Morbid obesity ([place]) 10/25/2018    Allergies:  Allergies  Allergen Reactions   Iodine Itching and Rash    LOCALIZED REACTION   Medications:  Current Outpatient Medications:    meloxicam (MOBIC) 15 MG tablet, Take 1 tablet (15 mg total) by mouth daily., Disp: 30 tablet, Rfl: 0   ferrous sulfate 325 (65 FE) MG EC tablet, Take 1 tablet (325 mg total) by mouth every other day., Disp: 45 tablet, Rfl: 0  Observations/Objective: Patient is well-developed, well-nourished  in no acute distress.  Resting comfortably at home.  Head is normocephalic, atraumatic.  No labored breathing.  Speech is clear and coherent with logical content.  Patient is alert and oriented at baseline.    Assessment and Plan: 1. Rib pain on left side - meloxicam (MOBIC) 15 MG tablet; Take 1 tablet (15 mg total) by mouth daily.  Dispense: 30 tablet; Refill:  0  - Possible strain/sprain/costochondritis secondary to compensating due to left arm weakness/pain - Add Meloxicam - Heating pad - Bracing with coughing/deep breathing - Seek in person evaluation if symptoms worsen, fail to improve, or change  Follow Up Instructions: I discussed the assessment and treatment plan with the patient. The patient was provided an opportunity to ask questions and all were answered. The patient agreed with the plan and demonstrated an understanding of the instructions.  A copy of instructions were sent to the patient via MyChart unless otherwise noted below.    The patient was advised to call back or seek an in-person evaluation if the symptoms worsen or if the condition fails to improve as anticipated.  Time:  I spent 18 minutes with the patient via telehealth technology discussing the above problems/concerns.    Mar Daring, PA-C

## 2022-09-25 ENCOUNTER — Ambulatory Visit: Payer: Medicaid Other | Admitting: Family Medicine

## 2022-09-25 ENCOUNTER — Encounter: Payer: Self-pay | Admitting: Family Medicine

## 2022-09-25 VITALS — BP 130/78 | Ht 65.0 in | Wt 260.0 lb

## 2022-09-25 DIAGNOSIS — M25512 Pain in left shoulder: Secondary | ICD-10-CM | POA: Diagnosis not present

## 2022-09-25 DIAGNOSIS — M19012 Primary osteoarthritis, left shoulder: Secondary | ICD-10-CM | POA: Diagnosis not present

## 2022-09-25 DIAGNOSIS — R0781 Pleurodynia: Secondary | ICD-10-CM | POA: Diagnosis not present

## 2022-09-25 DIAGNOSIS — R0789 Other chest pain: Secondary | ICD-10-CM

## 2022-09-25 MED ORDER — TRAMADOL HCL 50 MG PO TABS: 50.0000 mg | ORAL_TABLET | Freq: Every evening | ORAL | 1 refills | Status: DC | PRN

## 2022-09-25 MED ORDER — MELOXICAM 15 MG PO TABS: 15.0000 mg | ORAL_TABLET | Freq: Every day | ORAL | 2 refills | Status: DC

## 2022-09-25 NOTE — Progress Notes (Signed)
PCP: Gerrit Heck, MD  Subjective:   HPI: Patient is a 47 y.o. female here for left shoulder pain.  She reports she has had left shoulder pain for about 3 months.  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  She was seen in sports medicine clinic 1 month ago for this concern.  She has had an x-ray of the left shoulder in December 2023 which was overall unremarkable.  At last visit, shoulder pain was felt to be possibly from the A/C joint sprain and left shoulder bursitis.  She underwent corticosteroid injection of her AC joint and subacromial space.  She was sent home with rotator cuff exercises.  Today, patient reports that she has had slight improvement in the shoulder pain, maybe 30% improvement. She does report significant improvement in range of motion but is still limited.  She has difficulty with reaching overhead and reaching behind her. She has been doing the home exercises consistently. Has tried the meloxicam with minimal relief.  History reviewed. No pertinent past medical history.  Current Outpatient Medications on File Prior to Visit  Medication Sig Dispense Refill   ferrous sulfate 325 (65 FE) MG EC tablet Take 1 tablet (325 mg total) by mouth every other day. 45 tablet 0   meloxicam (MOBIC) 15 MG tablet Take 1 tablet (15 mg total) by mouth daily. 30 tablet 0   No current facility-administered medications on file prior to visit.    Past Surgical History:  Procedure Laterality Date   TUBAL LIGATION  2002    Allergies  Allergen Reactions   Iodine Itching and Rash    LOCALIZED REACTION    Social History   Socioeconomic History   Marital status: Single    Spouse name: Not on file   Number of children: Not on file   Years of education: Not on file   Highest education level: Not on file  Occupational History   Not on file  Tobacco Use   Smoking status: Former    Packs/day: 1.00    Years: 15.00    Additional pack years: 0.00     Total pack years: 15.00    Types: Cigarettes   Smokeless tobacco: Never  Substance and Sexual Activity   Alcohol use: Yes    Comment: Occasionally drinks at social events   Drug use: Not Currently   Sexual activity: Not Currently  Other Topics Concern   Not on file  Social History Narrative   Not on file   Social Determinants of Health   Financial Resource Strain: Unknown (10/27/2018)   Overall Financial Resource Strain (CARDIA)    Difficulty of Paying Living Expenses: Patient declined  Food Insecurity: Unknown (10/27/2018)   Hunger Vital Sign    Worried About Running Out of Food in the Last Year: Patient declined    [place] in the Last Year: Patient declined  Transportation Needs: Unknown (10/27/2018)   [place] - Hydrologist (Medical): Patient declined    Lack of Transportation (Non-Medical): Patient declined  Physical Activity: Unknown (10/27/2018)   Exercise Vital Sign    Days of Exercise per Week: Patient declined    Minutes of Exercise per Session: Patient declined  Stress: Unknown (10/27/2018)   [employer] [place]    Feeling of Stress : Patient declined  Social Connections: Unknown (10/27/2018)   Social Connection and Isolation Panel [NHANES]    Frequency of Communication with Friends  and Family: Patient declined    Frequency of Social Gatherings with Friends and Family: Patient declined    Attends Religious Services: Patient declined    Marine scientist or Organizations: Patient declined    Attends Archivist Meetings: Patient declined    Marital Status: Patient declined  Intimate Partner Violence: Unknown (10/27/2018)   Humiliation, Afraid, Rape, and Kick questionnaire    Fear of Current or Ex-Partner: Patient declined    Emotionally Abused: Patient declined    Physically Abused: Patient declined    Sexually Abused: Patient declined    Family History   Problem Relation Age of Onset   Breast cancer Mother 4   Kidney disease Mother    Early death Father 31   Diabetes Father     BP 130/78   Ht 5\' 5"  (1.651 m)   Wt 260 lb (117.9 kg)   BMI 43.27 kg/m   Review of Systems: See HPI above.     Objective:  Physical Exam:  Gen: awake, alert, NAD, comfortable in exam room Pulm: breathing unlabored  Left shoulder: No obvious swelling or deformity.  There is tenderness to palpation of the [facility] joint.  Active abduction limited to about 120 degrees but has full range of motion with passive abduction.  Internal rotation limited due to pain, barely able to reach the gluteal area.  Flexion significant limited secondary to pain.  4/5 strength with resisted internal rotation and external rotation secondary to pain.  Positive empty can test.  Unable to cooperate for Neer's test.   Assessment & Plan:  1. Left shoulder pain, chronic Atraumatic left shoulder pain ongoing for the past few months.  Modest improvement with subacromial and AC joint injections 1 month prior.  Low concern for adhesive capsulitis with good passive ROM.  Suspect this is primarily due to the [facility] joint given the tenderness on exam and prior ultrasound findings, though could consider some element of rotator cuff pathology.  Given minimal response to [facility] joint injection, I think she will benefit from consultation with an orthopedic specialist. - referral orthopedics - meloxicam refilled, will add tramadol prn for pain - continue home exercises  Zola Button, MD [place], PGY-3

## 2022-09-25 NOTE — Patient Instructions (Addendum)
Your shoulder is a little bit better.  I still think the majority of your issues are with the acromioclavicular joint.  I want to send you to an orthopedist for further evaluation.  They may or may not recommend surgical intervention.  After you get that opinion, we would be happy to see you back if you decide surgery is not the next step for you.  I am refilling your meloxicam.  It is not a medicine I would want you to be on forever but I think it will help with pain.  I will continue to take that daily unless you have some stomach issues.  Also giving you a small number of pills of tramadol which you can use for more severe pain.  It might make you sleepy so I would tend to use it when you are going to be at home, not working, not driving.  Great to see you!  Dr Merrily Pew Jake Seats & Yvonne Kendall [address] [place] [phone] Mon 09/28/22 at [place] time is 815a

## 2022-09-25 NOTE — Progress Notes (Signed)
[place] Attending Note: I have seen and examined this patient. I have discussed this patient with the resident and reviewed the assessment and plan as documented above. I agree with the resident's findings and plan. Follow-up left shoulder pain.  She had subacromial bursa injection as well as an acromioclavicular joint injection.  She had about 20% improvement in her pain and range of motion but overall she still has significant pain was almost any movement of her shoulder.  She has been working on the home exercise program it does not seem to be really helping her pain at all.  Reviewed her ultrasound images from last office visit where we did the ultrasound-guided acromioclavicular joint injection.  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  The ends of the bone also looked a little bit irregular.  I suspect this is the major problem for her diffuse shoulder pain.  We had asked her to return partly to consider glenohumeral joint injection but there is no sign of adhesive capsulitis or GHJ arthritis.  I will recommend she see orthopedics for further evaluation of the acromioclavicular joint as I think that is her most significant problem.  We will be happy to see her back at any time.  We have made her an appointment.

## 2022-09-28 ENCOUNTER — Telehealth: Payer: Self-pay

## 2022-09-28 DIAGNOSIS — M25512 Pain in left shoulder: Secondary | ICD-10-CM | POA: Diagnosis not present

## 2022-09-30 MED ORDER — GABAPENTIN 100 MG PO CAPS: 100.0000 mg | ORAL_CAPSULE | Freq: Two times a day (BID) | ORAL | 0 refills | Status: AC

## 2022-09-30 NOTE — Telephone Encounter (Signed)
Megan I sent in some gabapentin to take one twice a day. Tell her to try it when she is not working a it may make her sleepy THANKS! Dorcas Mcmurray

## 2022-10-10 DIAGNOSIS — M25512 Pain in left shoulder: Secondary | ICD-10-CM | POA: Diagnosis not present

## 2022-10-12 DIAGNOSIS — M19012 Primary osteoarthritis, left shoulder: Secondary | ICD-10-CM | POA: Diagnosis not present

## 2022-10-27 ENCOUNTER — Encounter: Payer: Self-pay | Admitting: Internal Medicine

## 2022-10-27 ENCOUNTER — Ambulatory Visit (AMBULATORY_SURGERY_CENTER): Payer: Medicaid Other | Admitting: Internal Medicine

## 2022-10-27 VITALS — BP 115/70 | HR 78 | Temp 98.7°F | Resp 17 | Ht 65.0 in | Wt 260.0 lb

## 2022-10-27 DIAGNOSIS — D123 Benign neoplasm of transverse colon: Secondary | ICD-10-CM

## 2022-10-27 DIAGNOSIS — D509 Iron deficiency anemia, unspecified: Secondary | ICD-10-CM

## 2022-10-27 DIAGNOSIS — D120 Benign neoplasm of cecum: Secondary | ICD-10-CM | POA: Diagnosis not present

## 2022-10-27 DIAGNOSIS — D122 Benign neoplasm of ascending colon: Secondary | ICD-10-CM

## 2022-10-27 DIAGNOSIS — D125 Benign neoplasm of sigmoid colon: Secondary | ICD-10-CM | POA: Diagnosis not present

## 2022-10-27 DIAGNOSIS — K317 Polyp of stomach and duodenum: Secondary | ICD-10-CM | POA: Diagnosis not present

## 2022-10-27 DIAGNOSIS — F419 Anxiety disorder, unspecified: Secondary | ICD-10-CM | POA: Diagnosis not present

## 2022-10-27 DIAGNOSIS — B9681 Helicobacter pylori [H. pylori] as the cause of diseases classified elsewhere: Secondary | ICD-10-CM | POA: Diagnosis not present

## 2022-10-27 DIAGNOSIS — Z1211 Encounter for screening for malignant neoplasm of colon: Secondary | ICD-10-CM

## 2022-10-27 DIAGNOSIS — K2950 Unspecified chronic gastritis without bleeding: Secondary | ICD-10-CM | POA: Diagnosis not present

## 2022-10-27 DIAGNOSIS — K2970 Gastritis, unspecified, without bleeding: Secondary | ICD-10-CM | POA: Diagnosis not present

## 2022-10-27 MED ORDER — SODIUM CHLORIDE 0.9 % IV SOLN: 500.0000 mL | INTRAVENOUS | Status: DC

## 2022-10-27 NOTE — Progress Notes (Signed)
GASTROENTEROLOGY PROCEDURE H&P NOTE   Primary Care Physician: Levin Erp, MD    Reason for Procedure:   IDA, colon cancer screening  Plan:    EGD/colonoscopy  Patient is appropriate for endoscopic procedure(s) in the ambulatory ([facility]) setting.  The nature of the procedure, as well as the risks, benefits, and alternatives were carefully and thoroughly reviewed with the patient. Ample time for discussion and questions allowed. The patient understood, was satisfied, and agreed to proceed.     HPI: Evelyn Joyce is a 47 y.o. female who presents for EGD/colonoscopy for evaluation of IDA and colon cancer screening .  Patient was most recently seen in the Gastroenterology Clinic on 08/27/22.  No interval change in medical history since that appointment. Please refer to that note for full details regarding GI history and clinical presentation.   Past Medical History:  Diagnosis Date   Allergy    Anemia    Anxiety    Blood transfusion without reported diagnosis     Past Surgical History:  Procedure Laterality Date   TUBAL LIGATION  2002    Prior to Admission medications   Medication Sig Start Date End Date Taking? Authorizing Provider  ferrous sulfate 325 (65 FE) MG EC tablet Take 1 tablet (325 mg total) by mouth every other day. 06/20/22 09/18/22  Levin Erp, MD  gabapentin (NEURONTIN) 100 MG capsule Take 1 capsule (100 mg total) by mouth 2 (two) times daily. 09/30/22   Nestor Ramp, MD  meloxicam (MOBIC) 15 MG tablet Take 1 tablet (15 mg total) by mouth daily. 09/25/22   Nestor Ramp, MD    Current Outpatient Medications  Medication Sig Dispense Refill   ferrous sulfate 325 (65 FE) MG EC tablet Take 1 tablet (325 mg total) by mouth every other day. 45 tablet 0   gabapentin (NEURONTIN) 100 MG capsule Take 1 capsule (100 mg total) by mouth 2 (two) times daily. 60 capsule 0   meloxicam (MOBIC) 15 MG tablet Take 1 tablet (15 mg total) by mouth daily. 30 tablet 2   Current  Facility-Administered Medications  Medication Dose Route Frequency Provider Last Rate Last Admin   0.9 %  sodium chloride infusion  500 mL Intravenous Continuous Imogene Burn, MD        Allergies as of 10/27/2022 - Review Complete 10/27/2022  Allergen Reaction Noted   Iodine Itching and Rash 11/28/2015    Family History  Problem Relation Age of Onset   Breast cancer Mother 93   Kidney disease Mother    Early death Father 28   Diabetes Father    Colon cancer Neg Hx    Esophageal cancer Neg Hx    Rectal cancer Neg Hx    Stomach cancer Neg Hx     Social History   Socioeconomic History   Marital status: Single    Spouse name: Not on file   Number of children: Not on file   Years of education: Not on file   Highest education level: Not on file  Occupational History   Not on file  Tobacco Use   Smoking status: Former    Packs/day: 1.00    Years: 15.00    Additional pack years: 0.00    Total pack years: 15.00    Types: Cigarettes   Smokeless tobacco: Never  Substance and Sexual Activity   Alcohol use: Yes    Comment: Occasionally drinks at social events   Drug use: Not Currently   Sexual activity: Not  Currently  Other Topics Concern   Not on file  Social History Narrative   Not on file   Social Determinants of Health   Financial Resource Strain: Unknown (10/27/2018)   Overall Financial Resource Strain (CARDIA)    Difficulty of Paying Living Expenses: Patient declined  Food Insecurity: Unknown (10/27/2018)   Hunger Vital Sign    Worried About Running Out of Food in the Last Year: Patient declined    Ran Out of Food in the Last Year: Patient declined  Transportation Needs: Unknown (10/27/2018)   PRAPARE - Administrator, Civil Service (Medical): Patient declined    Lack of Transportation (Non-Medical): Patient declined  Physical Activity: Unknown (10/27/2018)   Exercise Vital Sign    Days of Exercise per Week: Patient declined    Minutes of Exercise  per Session: Patient declined  Stress: Unknown (10/27/2018)   [employer] of Occupational Health - Occupational Stress Questionnaire    Feeling of Stress : Patient declined  Social Connections: Unknown (10/27/2018)   Social Connection and Isolation Panel [NHANES]    Frequency of Communication with Friends and Family: Patient declined    Frequency of Social Gatherings with Friends and Family: Patient declined    Attends Religious Services: Patient declined    Database administrator or Organizations: Patient declined    Attends Banker Meetings: Patient declined    Marital Status: Patient declined  Intimate Partner Violence: Unknown (10/27/2018)   Humiliation, Afraid, Rape, and Kick questionnaire    Fear of Current or Ex-Partner: Patient declined    Emotionally Abused: Patient declined    Physically Abused: Patient declined    Sexually Abused: Patient declined    Physical Exam: Vital signs in last 24 hours: BP 114/67   Pulse 94   Temp 98.7 F (37.1 C) (Skin)   Ht  (1.651 m)   Wt 260 lb (117.9 kg)   SpO2 98%   BMI 43.27 kg/m  GEN: NAD EYE: Sclerae anicteric ENT: MMM CV: Non-tachycardic Pulm: No increased WOB GI: Soft NEURO:  Alert & Oriented   Eulah Pont, MD [facility]   10/27/2022 8:43 AM

## 2022-10-27 NOTE — Progress Notes (Signed)
Pt. states no medical or surgical changes since previsit or office visit. 

## 2022-10-27 NOTE — Op Note (Signed)
[facility] Patient Name: Evelyn Joyce Procedure Date: 10/27/2022 9:26 AM MRN: [id number] Endoscopist: Madelyn Brunner [place] , , [id number] Age: 46 Referring MD:  Date of Birth: 21-May-1976 Gender: Female Account #: [id number] Procedure:                Colonoscopy Indications:              Screening for colorectal malignant neoplasm Medicines:                Monitored Anesthesia Care Procedure:                Pre-Anesthesia Assessment:                           - Prior to the procedure, a History and Physical                            was performed, and patient medications and                            allergies were reviewed. The patient's tolerance of                            previous anesthesia was also reviewed. The risks                            and benefits of the procedure and the sedation                            options and risks were discussed with the patient.                            All questions were answered, and informed consent                            was obtained. Prior Anticoagulants: The patient has                            taken no anticoagulant or antiplatelet agents. ASA                            Grade Assessment: II - A patient with mild systemic                            disease. After reviewing the risks and benefits,                            the patient was deemed in satisfactory condition to                            undergo the procedure.                           After obtaining informed consent, the colonoscope  was passed under direct vision. Throughout the                            procedure, the patient's blood pressure, pulse, and                            oxygen saturations were monitored continuously. The                            Olympus CF-[id number] ([id number]) Colonoscope was                            introduced through the anus and advanced to the the                            terminal  ileum. The colonoscopy was performed                            without difficulty. The patient tolerated the                            procedure well. The quality of the bowel                            preparation was good. The terminal ileum, ileocecal                            valve, appendiceal orifice, and rectum were                            photographed. Scope In: 9:44:42 AM Scope Out: 10:07:32 AM Scope Withdrawal Time: 0 hours 19 minutes 57 seconds  Total Procedure Duration: 0 hours 22 minutes 50 seconds  Findings:                 The terminal ileum appeared normal.                           Eight sessile polyps were found in the transverse                            colon, ascending colon and cecum. The polyps were 3                            to 6 mm in size. These polyps were removed with a                            cold snare. Resection and retrieval were complete.                           A 12 mm polyp was found in the sigmoid colon. The                            polyp was sessile. The polyp was  removed with a                            cold snare. Resection and retrieval were complete.                           Non-bleeding internal hemorrhoids were found during                            retroflexion. Complications:            No immediate complications. Estimated Blood Loss:     Estimated blood loss was minimal. Impression:               - The examined portion of the ileum was normal.                           - Eight 3 to 6 mm polyps in the transverse colon,                            in the ascending colon and in the cecum, removed                            with a cold snare. Resected and retrieved.                           - One 12 mm polyp in the sigmoid colon, removed                            with a cold snare. Resected and retrieved.                           - Non-bleeding internal hemorrhoids. Recommendation:           - Discharge patient to home (with  escort).                           - Continue daily iron supplementation                           - Await pathology results.                           - If hemoglobin continues to downtrend and heavy                            menstrual periods are better controlled, then could                            consider VCE in the future.                           - The findings and recommendations were discussed                            with the patient. Dr Alan Ripper  Norwood Levo "[address] [place],  10/27/2022 10:20:15 AM

## 2022-10-27 NOTE — Op Note (Signed)
[facility] Patient Name: Evelyn Joyce Procedure Date: 10/27/2022 9:27 AM MRN: [id number] Endoscopist: Madelyn Brunner [place] , , [id number] Age: 47 Referring MD:  Date of Birth: [birth year] Gender: Female Account #: [id number] Procedure:                Upper GI endoscopy Indications:              Iron deficiency anemia Medicines:                Monitored Anesthesia Care Procedure:                Pre-Anesthesia Assessment:                           - Prior to the procedure, a History and Physical                            was performed, and patient medications and                            allergies were reviewed. The patient's tolerance of                            previous anesthesia was also reviewed. The risks                            and benefits of the procedure and the sedation                            options and risks were discussed with the patient.                            All questions were answered, and informed consent                            was obtained. Prior Anticoagulants: The patient has                            taken no anticoagulant or antiplatelet agents. ASA                            Grade Assessment: II - A patient with mild systemic                            disease. After reviewing the risks and benefits,                            the patient was deemed in satisfactory condition to                            undergo the procedure.                           After obtaining informed consent, the endoscope was  passed under direct vision. Throughout the                            procedure, the patient's blood pressure, pulse, and                            oxygen saturations were monitored continuously. The                            Olympus Scope [id number] was introduced through the                            mouth, and advanced to the second part of duodenum.                            The upper GI endoscopy  was accomplished without                            difficulty. The patient tolerated the procedure                            well. Scope In: Scope Out: Findings:                 The examined esophagus was normal.                           Localized mildly erythematous mucosa without                            bleeding was found in the gastric antrum. Biopsies                            were taken with a cold forceps for Helicobacter                            pylori testing.                           Three 3 to 5 mm sessile polyps with no bleeding and                            no stigmata of recent bleeding were found in the                            gastric body and in the gastric antrum. These                            polyps were removed with a cold snare. Resection                            and retrieval were complete.                           The examined duodenum was normal.  Biopsies for                            histology were taken with a cold forceps for                            evaluation of celiac disease. Complications:            No immediate complications. Estimated Blood Loss:     Estimated blood loss was minimal. Impression:               - Normal esophagus.                           - Erythematous mucosa in the antrum. Biopsied.                           - Three gastric polyps. Resected and retrieved.                           - Normal examined duodenum. Biopsied. Recommendation:           - Await pathology results.                           - Perform a colonoscopy today. Dr Particia Lather "Alan Ripper" [place],  10/27/2022 10:13:29 AM

## 2022-10-27 NOTE — Patient Instructions (Signed)
Handouts Provided:  Polyps  YOU HAD AN ENDOSCOPIC PROCEDURE TODAY AT THE [facility]:   Refer to the procedure report that was given to you for any specific questions about what was found during the examination.  If the procedure report does not answer your questions, please call your gastroenterologist to clarify.  If you requested that your care partner not be given the details of your procedure findings, then the procedure report has been included in a sealed envelope for you to review at your convenience later.  YOU SHOULD EXPECT: Some feelings of bloating in the abdomen. Passage of more gas than usual.  Walking can help get rid of the air that was put into your GI tract during the procedure and reduce the bloating. If you had a lower endoscopy (such as a colonoscopy or flexible sigmoidoscopy) you may notice spotting of blood in your stool or on the toilet paper. If you underwent a bowel prep for your procedure, you may not have a normal bowel movement for a few days.  Please Note:  You might notice some irritation and congestion in your nose or some drainage.  This is from the oxygen used during your procedure.  There is no need for concern and it should clear up in a day or so.  SYMPTOMS TO REPORT IMMEDIATELY:  Following lower endoscopy (colonoscopy or flexible sigmoidoscopy):  Excessive amounts of blood in the stool  Significant tenderness or worsening of abdominal pains  Swelling of the abdomen that is new, acute  Fever of 100F or higher  Following upper endoscopy (EGD)  Vomiting of blood or coffee ground material  New chest pain or pain under the shoulder blades  Painful or persistently difficult swallowing  New shortness of breath  Fever of 100F or higher  Black, tarry-looking stools  For urgent or emergent issues, a gastroenterologist can be reached at any hour by calling [phone]. Do not use MyChart messaging for urgent concerns.    DIET:  We do recommend  a small meal at first, but then you may proceed to your regular diet.  Drink plenty of fluids but you should avoid alcoholic beverages for 24 hours.  ACTIVITY:  You should plan to take it easy for the rest of today and you should NOT DRIVE or use heavy machinery until tomorrow (because of the sedation medicines used during the test).    FOLLOW UP: Our staff will call the number listed on your records the next business day following your procedure.  We will call around 7:15- 8:00 am to check on you and address any questions or concerns that you may have regarding the information given to you following your procedure. If we do not reach you, we will leave a message.     If any biopsies were taken you will be contacted by phone or by letter within the next 1-3 weeks.  Please call us at [phone] if you have not heard about the biopsies in 3 weeks.    SIGNATURES/CONFIDENTIALITY: You and/or your care partner have signed paperwork which will be entered into your electronic medical record.  These signatures attest to the fact that that the information above on your After Visit Summary has been reviewed and is understood.  Full responsibility of the confidentiality of this discharge information lies with you and/or your care-partner.  

## 2022-10-27 NOTE — Progress Notes (Signed)
Called to room to assist during endoscopic procedure.  Patient ID and intended procedure confirmed with present staff. Received instructions for my participation in the procedure from the performing physician.  

## 2022-10-27 NOTE — Progress Notes (Signed)
Vss nad trans to pacu 

## 2022-10-28 ENCOUNTER — Other Ambulatory Visit: Payer: Self-pay | Admitting: Student

## 2022-10-28 ENCOUNTER — Telehealth: Payer: Self-pay | Admitting: *Deleted

## 2022-10-28 DIAGNOSIS — D509 Iron deficiency anemia, unspecified: Secondary | ICD-10-CM

## 2022-10-28 NOTE — Telephone Encounter (Signed)
  Follow up Call-     10/27/2022    8:36 AM  Call back number  Post procedure Call Back phone  # [phone]  Permission to leave phone message Yes     Patient questions:  Do you have a fever, pain , or abdominal swelling? No. Pain Score  0 *  Have you tolerated food without any problems? Yes.    Have you been able to return to your normal activities? Yes.    Do you have any questions about your discharge instructions: Diet   No. Medications  No. Follow up visit  No.  Do you have questions or concerns about your Care? No.  Actions: * If pain score is 4 or above: No action needed, pain <4.

## 2022-10-30 DIAGNOSIS — L600 Ingrowing nail: Secondary | ICD-10-CM | POA: Diagnosis not present

## 2022-10-30 DIAGNOSIS — M792 Neuralgia and neuritis, unspecified: Secondary | ICD-10-CM | POA: Diagnosis not present

## 2022-11-02 ENCOUNTER — Encounter: Payer: Self-pay | Admitting: Internal Medicine

## 2022-11-02 NOTE — Progress Notes (Signed)
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 Recommend bismuth quadruple therapy for treatment: - Tetracycline 500 mg QID x 14 days - Flagyl 250 mg QID x 14 days - Bismuth subsalicylate 524 mg QID x 14 days - PPI BID x 14 days  Let's arrange for GI clinic follow up in 6 weeks to discuss how to test for eradication.

## 2022-11-03 ENCOUNTER — Other Ambulatory Visit: Payer: Self-pay

## 2022-11-03 MED ORDER — BISMUTH SUBSALICYLATE 262 MG PO TABS: 2.0000 | ORAL_TABLET | Freq: Four times a day (QID) | ORAL | 0 refills | Status: AC

## 2022-11-03 MED ORDER — TETRACYCLINE HCL 500 MG PO CAPS: 500.0000 mg | ORAL_CAPSULE | Freq: Four times a day (QID) | ORAL | 0 refills | Status: DC

## 2022-11-03 MED ORDER — METRONIDAZOLE 250 MG PO TABS: 250.0000 mg | ORAL_TABLET | Freq: Four times a day (QID) | ORAL | 0 refills | Status: AC

## 2022-11-03 MED ORDER — OMEPRAZOLE 40 MG PO CPDR: 40.0000 mg | DELAYED_RELEASE_CAPSULE | Freq: Two times a day (BID) | ORAL | 0 refills | Status: DC

## 2022-11-04 ENCOUNTER — Ambulatory Visit: Payer: Medicaid Other | Admitting: Student

## 2022-11-04 ENCOUNTER — Other Ambulatory Visit: Payer: Self-pay

## 2022-11-04 ENCOUNTER — Telehealth: Payer: Self-pay | Admitting: Internal Medicine

## 2022-11-04 ENCOUNTER — Encounter: Payer: Self-pay | Admitting: Student

## 2022-11-04 VITALS — BP 118/53 | HR 63 | Ht 65.0 in | Wt 263.2 lb

## 2022-11-04 DIAGNOSIS — D509 Iron deficiency anemia, unspecified: Secondary | ICD-10-CM | POA: Diagnosis not present

## 2022-11-04 MED ORDER — DOXYCYCLINE HYCLATE 100 MG PO CAPS: 100.0000 mg | ORAL_CAPSULE | Freq: Two times a day (BID) | ORAL | 0 refills | Status: AC

## 2022-11-04 MED ORDER — FERROUS SULFATE 325 (65 FE) MG PO TBEC
DELAYED_RELEASE_TABLET | ORAL | 0 refills | Status: AC
Start: 2022-11-04 — End: ?

## 2022-11-04 NOTE — Patient Instructions (Signed)
It was great to see you! Thank you for allowing me to participate in your care!   Our plans for today:  - We will recheck your CBC today - I am refilling you iron pill - Message me if you are okay with getting the pelvic ultrasound!  Take care and seek immediate care sooner if you develop any concerns.  Levin Erp, MD

## 2022-11-04 NOTE — Assessment & Plan Note (Signed)
Most likely source of bleed is from her heavy menstrual periods.  Discussed the option of getting a pelvic ultrasound to evaluate for something like fibroids.  She would like to message via MyChart about how her next period is before committing to this.  She is gotten a recent colonoscopy with 8 loops that were removed.  She also has gastritis and H. Pylori.  Multiple sources of iron deficiency.  She has not been taking her iron tablet as she was not sure if she could take it with some of her other medicines that she had to hold of her colonoscopy.  I will refill this today for her.  Will check a CBC today to make sure that it is not dropping even further. -CBC -Patient to message me about pelvic ultrasound -Consider gynecology referral

## 2022-11-04 NOTE — Telephone Encounter (Signed)
Rx changed to Doxycycline 100 mg BID x 14 days sent to the [facility]. Tetracycline Rx canceled. Patient notified.

## 2022-11-04 NOTE — Progress Notes (Signed)
    SUBJECTIVE:   CHIEF COMPLAINT / HPI: Anemia   Last hemoglobin checked in 06/2022 showed a hemoglobin of 9.0.  Ferritin of 8.  Started on ferrous sulfate.  GI recently performed colonoscopy.  Did show chronic active gastritis.  Gastric biopsies obtained and showed H. pylori.  She is currently going to start taking these medications today.  She will follow-up with GI in 6 weeks for test for cure. Also showed eight tubular adenomas and will need repeat colonoscopy in 3 years. Denies any blood in stool or dark stools.  She does have very heavy periods. Last one on March 21--kind of like turning on faucet. Uses tampon and pad. Lasts for about 3 days. Soaks through ultra tampons 6-8 a day. Feels dizzy and tired during that time.  Unable to do anything during periods due to fatigue.  Has always had heavy periods.  Has never had a pelvic ultrasound before.  PERTINENT  PMH / PSH:   OBJECTIVE:   BP (!) 118/53   Pulse 63   Ht  (1.651 m)   Wt 263 lb 3.2 oz (119.4 kg)   LMP 10/01/2022   SpO2 99%   BMI 43.80 kg/m   General: Well appearing, NAD, awake, alert, responsive to questions Head: Normocephalic atraumatic CV: Regular rate and rhythm no murmurs rubs or gallops Respiratory: Clear to ausculation bilaterally, no wheezes rales or crackles, chest rises symmetrically,  no increased work of breathing Abdomen: Soft, non-tender, non-distended, normoactive bowel sounds  Extremities: Moves upper and lower extremities freely, no edema in LE  ASSESSMENT/PLAN:   Iron deficiency anemia Most likely source of bleed is from her heavy menstrual periods.  Discussed the option of getting a pelvic ultrasound to evaluate for something like fibroids.  She would like to message via MyChart about how her next period is before committing to this.  She is gotten a recent colonoscopy with 8 loops that were removed.  She also has gastritis and H. Pylori.  Multiple sources of iron deficiency.  She has not been  taking her iron tablet as she was not sure if she could take it with some of her other medicines that she had to hold of her colonoscopy.  I will refill this today for her.  Will check a CBC today to make sure that it is not dropping even further. -CBC -Patient to message me about pelvic ultrasound -Consider gynecology referral   Levin Erp, MD [facility]

## 2022-11-04 NOTE — Telephone Encounter (Signed)
Inbound call from patient, following up on note below. Patient did not wish to clarify the reason for her call.

## 2022-11-04 NOTE — Telephone Encounter (Signed)
Patient is requesting to speak about medications that were ordered yesterday. Please advise.

## 2022-11-04 NOTE — Telephone Encounter (Signed)
Spoke with the patient. She was unable to get the tetracycline as prescribed. Insurance will not cover it and it is too expensive out of pocket. Is it okay to change it to Doxycycline 100 mg BID for 14 days?

## 2022-11-05 LAB — CBC
Hematocrit: 31.8 % — ABNORMAL LOW (ref 34.0–46.6)
Hemoglobin: 9.7 g/dL — ABNORMAL LOW (ref 11.1–15.9)
MCH: 25 pg — ABNORMAL LOW (ref 26.6–33.0)
MCHC: 30.5 g/dL — ABNORMAL LOW (ref 31.5–35.7)
MCV: 82 fL (ref 79–97)
Platelets: 315 10*3/uL (ref 150–450)
RBC: 3.88 x10E6/uL (ref 3.77–5.28)
RDW: 15.8 % — ABNORMAL HIGH (ref 11.7–15.4)
WBC: 3.7 10*3/uL (ref 3.4–10.8)

## 2022-11-13 DIAGNOSIS — L600 Ingrowing nail: Secondary | ICD-10-CM | POA: Diagnosis not present

## 2022-11-13 DIAGNOSIS — M792 Neuralgia and neuritis, unspecified: Secondary | ICD-10-CM | POA: Diagnosis not present

## 2022-12-24 ENCOUNTER — Telehealth: Payer: Self-pay | Admitting: Internal Medicine

## 2022-12-24 ENCOUNTER — Ambulatory Visit: Payer: Medicaid Other | Admitting: Internal Medicine

## 2022-12-24 NOTE — Telephone Encounter (Signed)
Inbound call from patient making [place] aware she will not be able to come into her appointment today due to being at another appointment with a client.

## 2022-12-28 DIAGNOSIS — M25512 Pain in left shoulder: Secondary | ICD-10-CM | POA: Diagnosis not present

## 2022-12-31 ENCOUNTER — Other Ambulatory Visit: Payer: Medicaid Other

## 2022-12-31 ENCOUNTER — Ambulatory Visit: Payer: Medicaid Other | Admitting: Gastroenterology

## 2022-12-31 ENCOUNTER — Encounter: Payer: Self-pay | Admitting: Gastroenterology

## 2022-12-31 VITALS — BP 122/72 | HR 70 | Ht 66.0 in | Wt 259.2 lb

## 2022-12-31 DIAGNOSIS — R109 Unspecified abdominal pain: Secondary | ICD-10-CM | POA: Insufficient documentation

## 2022-12-31 DIAGNOSIS — R1011 Right upper quadrant pain: Secondary | ICD-10-CM

## 2022-12-31 DIAGNOSIS — Z8619 Personal history of other infectious and parasitic diseases: Secondary | ICD-10-CM

## 2022-12-31 NOTE — Progress Notes (Signed)
I agree with the assessment and plan as outlined by Ms. Zehr. 

## 2022-12-31 NOTE — Progress Notes (Signed)
12/31/2022 Evelyn Joyce [id number] September 07, 1975   HISTORY OF PRESENT ILLNESS: This is a 47 year old female who is a patient Dr. Derek Mound.  Both EGD and colonoscopy in April 2024.  Was found to have H. pylori gastritis on biopsies.  She was treated with quadruple therapy with tetracycline, Flagyl, bismuth, and twice daily PPI for 14 days.  She completed that several weeks ago (took all of the medication).  She is not currently on any PPI therapy.  Needs follow-up testing.  While she is here she is complaining of right upper quadrant abdominal pain.  Says that this really just began yesterday and has been occurring throughout the day yesterday, overnight, and during the day today.  Says it feels like something is squeezing and then releasing in her right upper quadrant.  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  No associated nausea, vomiting, etc.  Past Medical History:  Diagnosis Date   Allergy    Anemia    Anxiety    Blood transfusion without reported diagnosis     Past Surgical History:  Procedure Laterality Date   ingrown toe nail removal  07/17/2022   TUBAL LIGATION  2002    reports that she has quit smoking. Her smoking use included cigarettes. She has a 15.00 pack-year smoking history. She has never used smokeless tobacco. She reports current alcohol use. She reports that she does not currently use drugs. family history includes Breast cancer (age of onset: 58) in her mother; Diabetes in her father; Early death (age of onset: 13) in her father; Kidney disease in her mother. Allergies  Allergen Reactions   Iodine Itching and Rash    LOCALIZED REACTION      Outpatient Encounter Medications as of 12/31/2022  Medication Sig   ferrous sulfate 325 (65 FE) MG EC tablet TAKE 1 TABLET(325 MG) BY MOUTH EVERY OTHER DAY   gabapentin (NEURONTIN) 100 MG capsule Take 1 capsule (100 mg  total) by mouth 2 (two) times daily.   Multiple Vitamins-Minerals (MULTIVITAMIN) LIQD Take by mouth. [name] Liquid morning multivitamin 2 capfuls a day   [DISCONTINUED] meloxicam (MOBIC) 15 MG tablet Take 1 tablet (15 mg total) by mouth daily.   [DISCONTINUED] omeprazole (PRILOSEC) 40 MG capsule Take 1 capsule (40 mg total) by mouth 2 (two) times daily before a meal for 14 days.   No facility-administered encounter medications on file as of 12/31/2022.    REVIEW OF SYSTEMS  : All other systems reviewed and negative except where noted in the History of Present Illness.   PHYSICAL EXAM: Ht 5\' 6"  (1.676 m)   Wt 259 lb 4 oz (117.6 kg)   BMI 41.84 kg/m  General: Well developed female in no acute distress Head: Normocephalic and atraumatic Eyes:  Sclerae anicteric, conjunctiva pink. Ears: Normal auditory acuity Lungs: Clear throughout to auscultation; no W/R/R. Heart: Regular rate and rhythm; no M/R/G. Abdomen: Soft, non-distended.  BS present.  Non-tender. Musculoskeletal: Symmetrical with no gross deformities  Skin: No lesions on visible extremities Extremities: No edema  Neurological: Alert oriented x 4, grossly non-focal Psychological:  Alert and cooperative. Normal mood and affect  ASSESSMENT AND PLAN: *H. Pylori: Completed her treatment several weeks ago.  Is not on PPI therapy.  Will perform an H. pylori stool antigen to confirm eradication. *Right upper quadrant abdominal pain/right flank pain: This is new over the past  1-2 days, occurring like a squeezing pain several times throughout the day.  Will check a right upper quadrant abdominal ultrasound.  ? If it is muscular in origin.  CC:  Levin Erp, MD

## 2022-12-31 NOTE — Patient Instructions (Signed)
Your provider has requested that you go to the basement level for lab work before leaving today. Press "B" on the elevator. The lab is located at the first door on the left as you exit the elevator.   You have been scheduled for an abdominal ultrasound at [facility] (1st floor of hospital) on 01/07/23 at 8 am. Please arrive 30 minutes prior to your appointment for registration. Make certain not to have anything to eat or drink 6 hours prior to your appointment. Should you need to reschedule your appointment, please contact radiology at [phone]. This test typically takes about 30 minutes to perform.   _______________________________________________________  If your blood pressure at your visit was 140/90 or greater, please contact your primary care physician to follow up on this.  _______________________________________________________  If you are age 46 or older, your body mass index should be between 23-30. Your Body mass index is 41.84 kg/m. If this is out of the aforementioned range listed, please consider follow up with your Primary Care Provider.  If you are age 39 or younger, your body mass index should be between 19-25. Your Body mass index is 41.84 kg/m. If this is out of the aformentioned range listed, please consider follow up with your Primary Care Provider.   ________________________________________________________  The [facility] providers would like to encourage you to use [facility] to communicate with providers for non-urgent requests or questions.  Due to long hold times on the telephone, sending your provider a message by [facility] may be a faster and more efficient way to get a response.  Please allow 48 business hours for a response.  Please remember that this is for non-urgent requests.  _______________________________________________________

## 2023-01-05 ENCOUNTER — Ambulatory Visit: Payer: Medicaid Other

## 2023-01-05 DIAGNOSIS — Z8619 Personal history of other infectious and parasitic diseases: Secondary | ICD-10-CM | POA: Diagnosis not present

## 2023-01-07 ENCOUNTER — Ambulatory Visit (HOSPITAL_COMMUNITY)
Admission: RE | Admit: 2023-01-07 | Discharge: 2023-01-07 | Disposition: A | Discharge status: Home / Self Care | Payer: Medicaid Other | Source: Ambulatory Visit | Attending: Gastroenterology | Admitting: Gastroenterology

## 2023-01-07 DIAGNOSIS — Z8619 Personal history of other infectious and parasitic diseases: Secondary | ICD-10-CM | POA: Insufficient documentation

## 2023-01-07 DIAGNOSIS — R109 Unspecified abdominal pain: Secondary | ICD-10-CM | POA: Diagnosis not present

## 2023-01-07 DIAGNOSIS — R1011 Right upper quadrant pain: Secondary | ICD-10-CM | POA: Insufficient documentation

## 2023-01-07 LAB — H. PYLORI ANTIGEN, STOOL: H pylori Ag, Stl: NEGATIVE

## 2023-03-11 DIAGNOSIS — Z6841 Body Mass Index (BMI) 40.0 and over, adult: Secondary | ICD-10-CM | POA: Diagnosis not present

## 2023-03-11 DIAGNOSIS — D649 Anemia, unspecified: Secondary | ICD-10-CM | POA: Diagnosis not present

## 2023-03-11 DIAGNOSIS — Z136 Encounter for screening for cardiovascular disorders: Secondary | ICD-10-CM | POA: Diagnosis not present

## 2023-03-11 DIAGNOSIS — E559 Vitamin D deficiency, unspecified: Secondary | ICD-10-CM | POA: Diagnosis not present

## 2023-03-11 DIAGNOSIS — R7303 Prediabetes: Secondary | ICD-10-CM | POA: Diagnosis not present

## 2023-03-11 DIAGNOSIS — N951 Menopausal and female climacteric states: Secondary | ICD-10-CM | POA: Diagnosis not present

## 2023-03-19 DIAGNOSIS — M792 Neuralgia and neuritis, unspecified: Secondary | ICD-10-CM | POA: Diagnosis not present

## 2023-03-19 DIAGNOSIS — B351 Tinea unguium: Secondary | ICD-10-CM | POA: Diagnosis not present

## 2023-03-23 DIAGNOSIS — N951 Menopausal and female climacteric states: Secondary | ICD-10-CM | POA: Diagnosis not present

## 2023-03-23 DIAGNOSIS — M9905 Segmental and somatic dysfunction of pelvic region: Secondary | ICD-10-CM | POA: Diagnosis not present

## 2023-03-23 DIAGNOSIS — E559 Vitamin D deficiency, unspecified: Secondary | ICD-10-CM | POA: Diagnosis not present

## 2023-03-23 DIAGNOSIS — Z1339 Encounter for screening examination for other mental health and behavioral disorders: Secondary | ICD-10-CM | POA: Diagnosis not present

## 2023-03-23 DIAGNOSIS — M9903 Segmental and somatic dysfunction of lumbar region: Secondary | ICD-10-CM | POA: Diagnosis not present

## 2023-03-23 DIAGNOSIS — Z1331 Encounter for screening for depression: Secondary | ICD-10-CM | POA: Diagnosis not present

## 2023-03-23 DIAGNOSIS — E88819 Insulin resistance, unspecified: Secondary | ICD-10-CM | POA: Diagnosis not present

## 2023-03-23 DIAGNOSIS — Z6841 Body Mass Index (BMI) 40.0 and over, adult: Secondary | ICD-10-CM | POA: Diagnosis not present

## 2023-03-23 DIAGNOSIS — D649 Anemia, unspecified: Secondary | ICD-10-CM | POA: Diagnosis not present

## 2023-03-23 DIAGNOSIS — R7303 Prediabetes: Secondary | ICD-10-CM | POA: Diagnosis not present

## 2023-03-23 DIAGNOSIS — M5386 Other specified dorsopathies, lumbar region: Secondary | ICD-10-CM | POA: Diagnosis not present

## 2023-03-23 DIAGNOSIS — M9904 Segmental and somatic dysfunction of sacral region: Secondary | ICD-10-CM | POA: Diagnosis not present

## 2023-03-23 DIAGNOSIS — R635 Abnormal weight gain: Secondary | ICD-10-CM | POA: Diagnosis not present

## 2023-03-23 DIAGNOSIS — D509 Iron deficiency anemia, unspecified: Secondary | ICD-10-CM | POA: Diagnosis not present

## 2023-04-06 DIAGNOSIS — Z6841 Body Mass Index (BMI) 40.0 and over, adult: Secondary | ICD-10-CM | POA: Diagnosis not present

## 2023-04-06 DIAGNOSIS — D509 Iron deficiency anemia, unspecified: Secondary | ICD-10-CM | POA: Diagnosis not present

## 2023-04-15 DIAGNOSIS — Z6841 Body Mass Index (BMI) 40.0 and over, adult: Secondary | ICD-10-CM | POA: Diagnosis not present

## 2023-04-15 DIAGNOSIS — D500 Iron deficiency anemia secondary to blood loss (chronic): Secondary | ICD-10-CM | POA: Diagnosis not present

## 2023-04-15 DIAGNOSIS — Z1231 Encounter for screening mammogram for malignant neoplasm of breast: Secondary | ICD-10-CM | POA: Diagnosis not present

## 2023-04-15 DIAGNOSIS — E559 Vitamin D deficiency, unspecified: Secondary | ICD-10-CM | POA: Diagnosis not present

## 2023-04-15 DIAGNOSIS — Z23 Encounter for immunization: Secondary | ICD-10-CM | POA: Diagnosis not present

## 2023-04-15 DIAGNOSIS — E66813 Obesity, class 3: Secondary | ICD-10-CM | POA: Diagnosis not present

## 2023-04-16 ENCOUNTER — Other Ambulatory Visit (HOSPITAL_BASED_OUTPATIENT_CLINIC_OR_DEPARTMENT_OTHER): Payer: Self-pay

## 2023-04-16 DIAGNOSIS — R92323 Mammographic fibroglandular density, bilateral breasts: Secondary | ICD-10-CM | POA: Diagnosis not present

## 2023-04-16 DIAGNOSIS — Z1231 Encounter for screening mammogram for malignant neoplasm of breast: Secondary | ICD-10-CM | POA: Diagnosis not present

## 2023-04-17 ENCOUNTER — Other Ambulatory Visit (HOSPITAL_COMMUNITY): Payer: Self-pay

## 2023-04-17 MED ORDER — CICLOPIROX 8 % EX SOLN
CUTANEOUS | 5 refills | Status: AC
  Filled 2023-04-17: qty 6.6, 30d supply, fill #0

## 2023-04-17 MED ORDER — SEMAGLUTIDE-WEIGHT MANAGEMENT 0.25 MG/0.5ML ~~LOC~~ SOAJ
0.2500 mg | SUBCUTANEOUS | 2 refills | Status: AC
  Filled 2023-04-17: qty 0.5, 7d supply, fill #0
  Filled 2023-04-23 (×2): qty 2, 28d supply, fill #0

## 2023-04-17 MED ORDER — ERGOCALCIFEROL 1.25 MG (50000 UT) PO CAPS
50000.0000 [IU] | ORAL_CAPSULE | ORAL | 3 refills | Status: AC
  Filled 2023-04-17 – 2023-12-21 (×2): qty 5, 35d supply, fill #0

## 2023-04-17 MED ORDER — DICLOFENAC SODIUM 75 MG PO TBEC
75.0000 mg | DELAYED_RELEASE_TABLET | Freq: Two times a day (BID) | ORAL | 1 refills | Status: AC
  Filled 2023-04-17: qty 60, 30d supply, fill #0

## 2023-04-17 MED ORDER — SEMAGLUTIDE-WEIGHT MANAGEMENT 0.25 MG/0.5ML ~~LOC~~ SOAJ
0.2500 mg | SUBCUTANEOUS | 1 refills | Status: AC
  Filled 2023-04-17: qty 0.5, 7d supply, fill #0

## 2023-04-17 MED ORDER — MELOXICAM 15 MG PO TABS
15.0000 mg | ORAL_TABLET | Freq: Every day | ORAL | 2 refills | Status: AC
  Filled 2023-04-17: qty 30, 30d supply, fill #0

## 2023-04-17 MED ORDER — BISMUTH SUBSALICYLATE 262 MG PO CHEW
CHEWABLE_TABLET | ORAL | 1 refills | Status: AC
  Filled 2023-04-17: qty 30, 14d supply, fill #0

## 2023-04-17 MED ORDER — TERBINAFINE HCL 250 MG PO TABS
250.0000 mg | ORAL_TABLET | Freq: Every day | ORAL | 0 refills | Status: AC
  Filled 2023-04-17: qty 30, 30d supply, fill #0

## 2023-04-19 ENCOUNTER — Other Ambulatory Visit (HOSPITAL_COMMUNITY): Payer: Self-pay

## 2023-04-23 ENCOUNTER — Other Ambulatory Visit (HOSPITAL_BASED_OUTPATIENT_CLINIC_OR_DEPARTMENT_OTHER): Payer: Self-pay

## 2023-04-23 ENCOUNTER — Other Ambulatory Visit (HOSPITAL_COMMUNITY): Payer: Self-pay

## 2023-04-24 ENCOUNTER — Other Ambulatory Visit (HOSPITAL_COMMUNITY): Payer: Self-pay

## 2023-04-26 ENCOUNTER — Other Ambulatory Visit (HOSPITAL_COMMUNITY): Payer: Self-pay

## 2023-04-26 ENCOUNTER — Other Ambulatory Visit (HOSPITAL_BASED_OUTPATIENT_CLINIC_OR_DEPARTMENT_OTHER): Payer: Self-pay

## 2023-05-03 ENCOUNTER — Telehealth: Payer: Medicaid Other | Admitting: Physician Assistant

## 2023-05-03 DIAGNOSIS — L0591 Pilonidal cyst without abscess: Secondary | ICD-10-CM | POA: Diagnosis not present

## 2023-05-03 MED ORDER — DOXYCYCLINE HYCLATE 100 MG PO TABS: 100.0000 mg | ORAL_TABLET | Freq: Two times a day (BID) | ORAL | 0 refills | Status: AC

## 2023-05-03 NOTE — Progress Notes (Signed)
Virtual Visit Consent   Evelyn Joyce, you are scheduled for a virtual visit with a [facility] provider today. Just as with appointments in the office, your consent must be obtained to participate. Your consent will be active for this visit and any virtual visit you may have with one of our providers in the next 365 days. If you have a MyChart account, a copy of this consent can be sent to you electronically.  As this is a virtual visit, video technology does not allow for your provider to perform a traditional examination. This may limit your provider's ability to fully assess your condition. If your provider identifies any concerns that need to be evaluated in person or the need to arrange testing (such as labs, EKG, etc.), we will make arrangements to do so. Although advances in technology are sophisticated, we cannot ensure that it will always work on either your end or our end. If the connection with a video visit is poor, the visit may have to be switched to a telephone visit. With either a video or telephone visit, we are not always able to ensure that we have a secure connection.  By engaging in this virtual visit, you consent to the provision of healthcare and authorize for your insurance to be billed (if applicable) for the services provided during this visit. Depending on your insurance coverage, you may receive a charge related to this service.  I need to obtain your verbal consent now. Are you willing to proceed with your visit today? Evelyn Joyce has provided verbal consent on 05/03/2023 for a virtual visit (video or telephone). [name], PA-C  Date: 05/03/2023 4:55 PM  Virtual Visit via Video Note   I, [name], connected with  Evelyn Joyce  ([id number], 46-Mar-1977) on 05/03/23 at  4:45 PM EDT by a video-enabled telemedicine application and verified that I am speaking with the correct person using two identifiers.  Location: Patient: Virtual Visit Location  Patient: Mobile Provider: Virtual Visit Location Provider: Home Office   I discussed the limitations of evaluation and management by telemedicine and the availability of in person appointments. The patient expressed understanding and agreed to proceed.    History of Present Illness: Evelyn Joyce is a 47 y.o. who identifies as a female who was assigned female at birth, and is being seen today for recurrent boil/cyst. Last documented on 11/08/20.  Pilonidal cyst in same location. Started to become more tender about 2 weeks ago. Is not draining. It is swollen, hard, tender, warm to touch. Denies fevers, chills, nausea, vomiting. Has tried warm compresses, tylenol, and ibuprofen.    Problems:  Patient Active Problem List   Diagnosis Date Noted   RUQ abdominal pain 12/31/2022   Flank pain 12/31/2022   History of Helicobacter pylori infection 12/31/2022   Iron deficiency anemia 11/04/2022   Fatigue 06/19/2022   Acute pain of left shoulder 06/19/2022   Health care maintenance 06/19/2022   Morbid obesity (HCC) 10/25/2018    Allergies:  Allergies  Allergen Reactions   Iodine Itching and Rash    LOCALIZED REACTION   Medications:  Current Outpatient Medications:    doxycycline (VIBRA-TABS) 100 MG tablet, Take 1 tablet (100 mg total) by mouth 2 (two) times daily., Disp: 20 tablet, Rfl: 0   bismuth subsalicylate (PEPTO-BISMOL) 262 MG chewable tablet, Take 2 (two) tablets by mouth in the morning, at noon, and at bedtime for 14 days., Disp: 30 tablet, Rfl: 1   ciclopirox (PENLAC) 8 %  solution, Apply topically to the affected area daily., Disp: 6.6 mL, Rfl: 5   diclofenac (VOLTAREN) 75 MG EC tablet, Take 1 tablet (75 mg total) twice daily, Disp: 60 tablet, Rfl: 1   ergocalciferol (VITAMIN D2) 1.25 MG (50000 UT) capsule, Take 1 capsule (50,000 Units total) by mouth once a week., Disp: 5 capsule, Rfl: 3   ferrous sulfate 325 (65 FE) MG EC tablet, TAKE 1 TABLET(325 MG) BY MOUTH EVERY OTHER DAY, Disp:  45 tablet, Rfl: 0   gabapentin (NEURONTIN) 100 MG capsule, Take 1 capsule (100 mg total) by mouth 2 (two) times daily., Disp: 60 capsule, Rfl: 0   meloxicam (MOBIC) 15 MG tablet, Take 1 tablet (15 mg total) by mouth daily., Disp: 30 tablet, Rfl: 2   Multiple Vitamins-Minerals (MULTIVITAMIN) LIQD, Take by mouth. [name] Liquid morning multivitamin 2 capfuls a day, Disp: , Rfl:    Semaglutide-Weight Management 0.25 MG/0.5ML SOAJ, Inject 0.25 mg into the skin once a week., Disp: 2 mL, Rfl: 2   Semaglutide-Weight Management 0.25 MG/0.5ML SOAJ, Inject 0.25 mg into the skin once a week., Disp: 0.5 mL, Rfl: 1   terbinafine (LAMISIL) 250 MG tablet, Take 1 tablet (250 mg total) by mouth daily., Disp: 30 tablet, Rfl: 0  Observations/Objective: Patient is well-developed, well-nourished in no acute distress.  Resting comfortably  Head is normocephalic, atraumatic.  No labored breathing.  Speech is clear and coherent with logical content.  Patient is alert and oriented at baseline.    Assessment and Plan: 1. Pilonidal cyst - doxycycline (VIBRA-TABS) 100 MG tablet; Take 1 tablet (100 mg total) by mouth 2 (two) times daily.  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 The patient was provided an opportunity to ask questions and all were answered. The patient agreed with the plan and demonstrated an understanding of the instructions.  A copy of instructions were sent to the patient via MyChart unless otherwise noted below.    The patient was advised to call back or seek an in-person evaluation if the symptoms worsen or if the condition fails to improve as anticipated.    [name], PA-C

## 2023-05-03 NOTE — Patient Instructions (Signed)
Evelyn Joyce, thank you for joining Evelyn Loveless, PA-C for today's virtual visit.  While this provider is not your primary care provider (PCP), if your PCP is located in our provider database this encounter information will be shared with them immediately following your visit.   A [facility] MyChart account gives you access to today's visit and all your visits, tests, and labs performed at [facility] " click here if you don't have a [facility] MyChart account or go to [email or web address]  Consent: (Patient) Evelyn Joyce provided verbal consent for this virtual visit at the beginning of the encounter.  Current Medications:  Current Outpatient Medications:    doxycycline (VIBRA-TABS) 100 MG tablet, Take 1 tablet (100 mg total) by mouth 2 (two) times daily., Disp: 20 tablet, Rfl: 0   bismuth subsalicylate (PEPTO-BISMOL) 262 MG chewable tablet, Take 2 (two) tablets by mouth in the morning, at noon, and at bedtime for 14 days., Disp: 30 tablet, Rfl: 1   ciclopirox (PENLAC) 8 % solution, Apply topically to the affected area daily., Disp: 6.6 mL, Rfl: 5   diclofenac (VOLTAREN) 75 MG EC tablet, Take 1 tablet (75 mg total) twice daily, Disp: 60 tablet, Rfl: 1   ergocalciferol (VITAMIN D2) 1.25 MG (50000 UT) capsule, Take 1 capsule (50,000 Units total) by mouth once a week., Disp: 5 capsule, Rfl: 3   ferrous sulfate 325 (65 FE) MG EC tablet, TAKE 1 TABLET(325 MG) BY MOUTH EVERY OTHER DAY, Disp: 45 tablet, Rfl: 0   gabapentin (NEURONTIN) 100 MG capsule, Take 1 capsule (100 mg total) by mouth 2 (two) times daily., Disp: 60 capsule, Rfl: 0   meloxicam (MOBIC) 15 MG tablet, Take 1 tablet (15 mg total) by mouth daily., Disp: 30 tablet, Rfl: 2   Multiple Vitamins-Minerals (MULTIVITAMIN) LIQD, Take by mouth. [name] Liquid morning multivitamin 2 capfuls a day, Disp: , Rfl:    Semaglutide-Weight Management 0.25 MG/0.5ML SOAJ, Inject 0.25 mg into the skin once a week., Disp: 2 mL,  Rfl: 2   Semaglutide-Weight Management 0.25 MG/0.5ML SOAJ, Inject 0.25 mg into the skin once a week., Disp: 0.5 mL, Rfl: 1   terbinafine (LAMISIL) 250 MG tablet, Take 1 tablet (250 mg total) by mouth daily., Disp: 30 tablet, Rfl: 0   Medications ordered in this encounter:  Meds ordered this encounter  Medications   doxycycline (VIBRA-TABS) 100 MG tablet    Sig: Take 1 tablet (100 mg total) by mouth 2 (two) times daily.    Dispense:  20 tablet    Refill:  0    Order Specific Question:   Supervising Provider    Answer:   Merrilee Jansky [id number]     *If you need refills on other medications prior to your next appointment, please contact your pharmacy*  Follow-Up: Call back or seek an in-person evaluation if the symptoms worsen or if the condition fails to improve as anticipated.   Virtual Care [phone]  Other Instructions  Pilonidal Cyst  A pilonidal cyst is a fluid-filled sac that forms under the skin near the tailbone, at the top of the crease of the buttocks (pilonidal area). Cysts that are small and not infected may not cause any problems. Cysts that become irritated or infected may grow and fill with pus. An infected cyst is called an abscess. A pilonidal abscess may cause pain and swelling. It may need to be drained or removed. What are the causes? The cause of this condition is not always known.  In some cases, it may be caused by a hair that grows into your skin (ingrown hair). What increases the risk? You are more likely to develop this condition if: You are female. You have lots of hair near the crease of the buttocks. You are overweight. You have a dimple near the crease of the buttocks. You wear tight clothing. You do not bathe or shower often. You sit for long periods of time. What are the signs or symptoms? Symptoms of this condition may include pain, swelling, redness, and warmth in the pilonidal area. You may also be able to feel a lump near your  tailbone if your cyst is big. If your cyst becomes infected, symptoms may include: Pus or fluid drainage. Fever. Pain, swelling, and redness getting worse. The lump getting bigger. How is this diagnosed? This condition may be diagnosed based on: Your symptoms and medical history. A physical exam. A blood test to check for infection. A test of a pus sample. How is this treated? You may not need any treatment if your cyst does not cause symptoms. If your cyst bothers you or is infected, you may need a procedure to drain or remove the cyst. Depending on the size, location, and severity of your cyst, your health care provider may: Make an incision in the cyst and drain it (incision anddrainage). Open and drain the cyst, and then stitch the wound so that it stays open while it heals (marsupialization). You will be given instructions about how to care for your open wound while it heals. Remove all or part of the cyst, and then close the wound (cyst removal). You may need to take antibiotics before your procedure. Follow these instructions at home: Medicines Take over-the-counter and prescription medicines only as told by your health care provider. If you were prescribed antibiotics, take them as told by your health care provider. Do not stop taking the antibiotic even if you start to feel better. General instructions Keep the area around the cyst clean and dry. If there is fluid or pus draining from your cyst: Cover the area with a clean bandage (dressing). Wash the area gently with soap and water. Pat the area dry with a clean towel. Do not rub the area because that may cause bleeding. Remove hair from the area around the cyst only if your health care provider tells you to. Do not wear tight pants or sit in one position for long periods at a time. Contact a health care provider if: You have new redness, swelling, or pain. You have a fever. You have severe pain. Summary A pilonidal cyst  is a fluid-filled sac that forms under the skin near the tailbone, at the top of the crease of the buttocks (pilonidal area). Cysts that become irritated or infected may grow and fill with pus. An infected cyst is called an abscess. The cause of this condition is not always known. In some cases, it may be caused by a hair that grows into your skin (ingrown hair). You may not need any treatment if your cyst does not cause symptoms. If your cyst bothers you or is infected, you may need a procedure to drain or remove the cyst. This information is not intended to replace advice given to you by your health care provider. Make sure you discuss any questions you have with your health care provider. Document Revised: 09/24/2021 Document Reviewed: 09/24/2021 Elsevier Patient Education  2024 [employer]    If you have been instructed to  have an in-person evaluation today at a local Urgent Care facility, please use the link below. It will take you to a list of all of our available [facility], including address, phone number and hours of operation. Please do not delay care.  [facility]  If you or a family member do not have a primary care provider, use the link below to schedule a visit and establish care. When you choose a [facility] primary care physician or advanced practice provider, you gain a long-term partner in health. Find a Primary Care Provider  Learn more about [facility] in-office and virtual care options: [facility] - Get Care Now

## 2023-05-05 DIAGNOSIS — D509 Iron deficiency anemia, unspecified: Secondary | ICD-10-CM | POA: Diagnosis not present

## 2023-05-05 DIAGNOSIS — D500 Iron deficiency anemia secondary to blood loss (chronic): Secondary | ICD-10-CM | POA: Diagnosis not present

## 2023-05-05 DIAGNOSIS — N924 Excessive bleeding in the premenopausal period: Secondary | ICD-10-CM | POA: Diagnosis not present

## 2023-05-11 DIAGNOSIS — D500 Iron deficiency anemia secondary to blood loss (chronic): Secondary | ICD-10-CM | POA: Diagnosis not present

## 2023-05-12 DIAGNOSIS — B351 Tinea unguium: Secondary | ICD-10-CM | POA: Diagnosis not present

## 2023-05-12 DIAGNOSIS — L608 Other nail disorders: Secondary | ICD-10-CM | POA: Diagnosis not present

## 2023-05-12 DIAGNOSIS — M792 Neuralgia and neuritis, unspecified: Secondary | ICD-10-CM | POA: Diagnosis not present

## 2023-05-12 DIAGNOSIS — L603 Nail dystrophy: Secondary | ICD-10-CM | POA: Diagnosis not present

## 2023-05-17 DIAGNOSIS — E66813 Obesity, class 3: Secondary | ICD-10-CM | POA: Diagnosis not present

## 2023-05-17 DIAGNOSIS — Z6841 Body Mass Index (BMI) 40.0 and over, adult: Secondary | ICD-10-CM | POA: Diagnosis not present

## 2023-05-18 DIAGNOSIS — D500 Iron deficiency anemia secondary to blood loss (chronic): Secondary | ICD-10-CM | POA: Diagnosis not present

## 2023-07-12 DIAGNOSIS — Z6841 Body Mass Index (BMI) 40.0 and over, adult: Secondary | ICD-10-CM | POA: Diagnosis not present

## 2023-07-12 DIAGNOSIS — R600 Localized edema: Secondary | ICD-10-CM | POA: Diagnosis not present

## 2023-07-12 DIAGNOSIS — E66813 Obesity, class 3: Secondary | ICD-10-CM | POA: Diagnosis not present

## 2023-07-29 DIAGNOSIS — D500 Iron deficiency anemia secondary to blood loss (chronic): Secondary | ICD-10-CM | POA: Diagnosis not present

## 2023-07-29 DIAGNOSIS — R5383 Other fatigue: Secondary | ICD-10-CM | POA: Diagnosis not present

## 2023-07-29 DIAGNOSIS — N924 Excessive bleeding in the premenopausal period: Secondary | ICD-10-CM | POA: Diagnosis not present

## 2023-08-06 ENCOUNTER — Other Ambulatory Visit (HOSPITAL_BASED_OUTPATIENT_CLINIC_OR_DEPARTMENT_OTHER): Payer: Self-pay

## 2023-08-06 DIAGNOSIS — E66813 Obesity, class 3: Secondary | ICD-10-CM | POA: Diagnosis not present

## 2023-08-06 DIAGNOSIS — Z6841 Body Mass Index (BMI) 40.0 and over, adult: Secondary | ICD-10-CM | POA: Diagnosis not present

## 2023-08-06 MED ORDER — WEGOVY 1.7 MG/0.75ML ~~LOC~~ SOAJ
1.7000 mg | SUBCUTANEOUS | 1 refills | Status: DC
  Filled 2023-08-06: qty 3, 28d supply, fill #0

## 2023-08-09 DIAGNOSIS — D500 Iron deficiency anemia secondary to blood loss (chronic): Secondary | ICD-10-CM | POA: Diagnosis not present

## 2023-09-03 ENCOUNTER — Other Ambulatory Visit (HOSPITAL_BASED_OUTPATIENT_CLINIC_OR_DEPARTMENT_OTHER): Payer: Self-pay

## 2023-09-03 DIAGNOSIS — E66813 Obesity, class 3: Secondary | ICD-10-CM | POA: Diagnosis not present

## 2023-09-03 DIAGNOSIS — Z6841 Body Mass Index (BMI) 40.0 and over, adult: Secondary | ICD-10-CM | POA: Diagnosis not present

## 2023-09-03 MED ORDER — WEGOVY 2.4 MG/0.75ML ~~LOC~~ SOAJ
2.4000 mg | SUBCUTANEOUS | 1 refills | Status: DC
  Filled 2023-09-03: qty 3, 28d supply, fill #0
  Filled 2023-09-27: qty 3, 28d supply, fill #1

## 2023-09-27 ENCOUNTER — Other Ambulatory Visit (HOSPITAL_BASED_OUTPATIENT_CLINIC_OR_DEPARTMENT_OTHER): Payer: Self-pay

## 2023-10-20 DIAGNOSIS — M9913 Subluxation complex (vertebral) of lumbar region: Secondary | ICD-10-CM | POA: Diagnosis not present

## 2023-10-20 DIAGNOSIS — M9915 Subluxation complex (vertebral) of pelvic region: Secondary | ICD-10-CM | POA: Diagnosis not present

## 2023-10-20 DIAGNOSIS — M9914 Subluxation complex (vertebral) of sacral region: Secondary | ICD-10-CM | POA: Diagnosis not present

## 2023-10-27 DIAGNOSIS — M9914 Subluxation complex (vertebral) of sacral region: Secondary | ICD-10-CM | POA: Diagnosis not present

## 2023-10-27 DIAGNOSIS — M9913 Subluxation complex (vertebral) of lumbar region: Secondary | ICD-10-CM | POA: Diagnosis not present

## 2023-10-27 DIAGNOSIS — M9915 Subluxation complex (vertebral) of pelvic region: Secondary | ICD-10-CM | POA: Diagnosis not present

## 2023-10-28 DIAGNOSIS — M9915 Subluxation complex (vertebral) of pelvic region: Secondary | ICD-10-CM | POA: Diagnosis not present

## 2023-10-28 DIAGNOSIS — M9913 Subluxation complex (vertebral) of lumbar region: Secondary | ICD-10-CM | POA: Diagnosis not present

## 2023-10-28 DIAGNOSIS — M9914 Subluxation complex (vertebral) of sacral region: Secondary | ICD-10-CM | POA: Diagnosis not present

## 2023-11-03 DIAGNOSIS — M9914 Subluxation complex (vertebral) of sacral region: Secondary | ICD-10-CM | POA: Diagnosis not present

## 2023-11-03 DIAGNOSIS — Z6841 Body Mass Index (BMI) 40.0 and over, adult: Secondary | ICD-10-CM | POA: Diagnosis not present

## 2023-11-03 DIAGNOSIS — E66813 Obesity, class 3: Secondary | ICD-10-CM | POA: Diagnosis not present

## 2023-11-03 DIAGNOSIS — M9913 Subluxation complex (vertebral) of lumbar region: Secondary | ICD-10-CM | POA: Diagnosis not present

## 2023-11-03 DIAGNOSIS — M9915 Subluxation complex (vertebral) of pelvic region: Secondary | ICD-10-CM | POA: Diagnosis not present

## 2023-11-05 ENCOUNTER — Other Ambulatory Visit (HOSPITAL_BASED_OUTPATIENT_CLINIC_OR_DEPARTMENT_OTHER): Payer: Self-pay

## 2023-11-05 MED ORDER — WEGOVY 2.4 MG/0.75ML ~~LOC~~ SOAJ
2.4000 mg | SUBCUTANEOUS | 2 refills | Status: AC
  Filled 2023-11-05: qty 3, 28d supply, fill #0
  Filled 2023-11-28: qty 3, 28d supply, fill #1
  Filled 2023-12-21: qty 3, 28d supply, fill #2

## 2023-12-08 ENCOUNTER — Other Ambulatory Visit (HOSPITAL_BASED_OUTPATIENT_CLINIC_OR_DEPARTMENT_OTHER): Payer: Self-pay

## 2023-12-08 DIAGNOSIS — M545 Low back pain, unspecified: Secondary | ICD-10-CM | POA: Diagnosis not present

## 2023-12-08 DIAGNOSIS — G8929 Other chronic pain: Secondary | ICD-10-CM | POA: Diagnosis not present

## 2023-12-08 DIAGNOSIS — Z6841 Body Mass Index (BMI) 40.0 and over, adult: Secondary | ICD-10-CM | POA: Diagnosis not present

## 2023-12-08 DIAGNOSIS — E66813 Obesity, class 3: Secondary | ICD-10-CM | POA: Diagnosis not present

## 2023-12-08 DIAGNOSIS — R2242 Localized swelling, mass and lump, left lower limb: Secondary | ICD-10-CM | POA: Diagnosis not present

## 2023-12-08 DIAGNOSIS — M25511 Pain in right shoulder: Secondary | ICD-10-CM | POA: Diagnosis not present

## 2023-12-08 MED ORDER — WEGOVY 2.4 MG/0.75ML ~~LOC~~ SOAJ
2.4000 mg | SUBCUTANEOUS | 2 refills | Status: AC
  Filled 2023-12-21 – 2024-04-11 (×3): qty 3, 28d supply, fill #0
  Filled 2024-05-09 – 2024-08-13 (×2): qty 3, 28d supply, fill #1

## 2023-12-20 DIAGNOSIS — R2242 Localized swelling, mass and lump, left lower limb: Secondary | ICD-10-CM | POA: Diagnosis not present

## 2023-12-20 DIAGNOSIS — M7752 Other enthesopathy of left foot: Secondary | ICD-10-CM | POA: Diagnosis not present

## 2023-12-20 DIAGNOSIS — M79672 Pain in left foot: Secondary | ICD-10-CM | POA: Diagnosis not present

## 2023-12-21 ENCOUNTER — Other Ambulatory Visit (HOSPITAL_BASED_OUTPATIENT_CLINIC_OR_DEPARTMENT_OTHER): Payer: Self-pay

## 2023-12-21 ENCOUNTER — Encounter: Payer: Self-pay | Admitting: *Deleted

## 2023-12-22 ENCOUNTER — Other Ambulatory Visit: Payer: Self-pay

## 2023-12-22 ENCOUNTER — Other Ambulatory Visit (HOSPITAL_BASED_OUTPATIENT_CLINIC_OR_DEPARTMENT_OTHER): Payer: Self-pay

## 2024-01-17 ENCOUNTER — Other Ambulatory Visit (HOSPITAL_BASED_OUTPATIENT_CLINIC_OR_DEPARTMENT_OTHER): Payer: Self-pay

## 2024-01-17 DIAGNOSIS — E66813 Obesity, class 3: Secondary | ICD-10-CM | POA: Diagnosis not present

## 2024-01-17 DIAGNOSIS — Z6841 Body Mass Index (BMI) 40.0 and over, adult: Secondary | ICD-10-CM | POA: Diagnosis not present

## 2024-01-17 DIAGNOSIS — M79672 Pain in left foot: Secondary | ICD-10-CM | POA: Diagnosis not present

## 2024-01-17 MED ORDER — WEGOVY 2.4 MG/0.75ML ~~LOC~~ SOAJ
0.7500 mL | SUBCUTANEOUS | 2 refills | Status: AC
  Filled 2024-01-17: qty 3, 28d supply, fill #0
  Filled 2024-02-14: qty 3, 28d supply, fill #1
  Filled 2024-03-22: qty 3, 28d supply, fill #2

## 2024-02-14 ENCOUNTER — Other Ambulatory Visit (HOSPITAL_BASED_OUTPATIENT_CLINIC_OR_DEPARTMENT_OTHER): Payer: Self-pay

## 2024-03-22 ENCOUNTER — Other Ambulatory Visit (HOSPITAL_BASED_OUTPATIENT_CLINIC_OR_DEPARTMENT_OTHER): Payer: Self-pay

## 2024-03-28 ENCOUNTER — Other Ambulatory Visit (HOSPITAL_BASED_OUTPATIENT_CLINIC_OR_DEPARTMENT_OTHER): Payer: Self-pay

## 2024-04-11 ENCOUNTER — Other Ambulatory Visit (HOSPITAL_BASED_OUTPATIENT_CLINIC_OR_DEPARTMENT_OTHER): Payer: Self-pay

## 2024-04-11 DIAGNOSIS — M9913 Subluxation complex (vertebral) of lumbar region: Secondary | ICD-10-CM | POA: Diagnosis not present

## 2024-04-11 DIAGNOSIS — M9912 Subluxation complex (vertebral) of thoracic region: Secondary | ICD-10-CM | POA: Diagnosis not present

## 2024-04-11 DIAGNOSIS — M9914 Subluxation complex (vertebral) of sacral region: Secondary | ICD-10-CM | POA: Diagnosis not present

## 2024-04-11 DIAGNOSIS — M9915 Subluxation complex (vertebral) of pelvic region: Secondary | ICD-10-CM | POA: Diagnosis not present

## 2024-05-05 ENCOUNTER — Other Ambulatory Visit (HOSPITAL_BASED_OUTPATIENT_CLINIC_OR_DEPARTMENT_OTHER): Payer: Self-pay

## 2024-05-05 DIAGNOSIS — Z1231 Encounter for screening mammogram for malignant neoplasm of breast: Secondary | ICD-10-CM | POA: Diagnosis not present

## 2024-05-05 DIAGNOSIS — Z23 Encounter for immunization: Secondary | ICD-10-CM | POA: Diagnosis not present

## 2024-05-05 DIAGNOSIS — Z0000 Encounter for general adult medical examination without abnormal findings: Secondary | ICD-10-CM | POA: Diagnosis not present

## 2024-05-05 DIAGNOSIS — D500 Iron deficiency anemia secondary to blood loss (chronic): Secondary | ICD-10-CM | POA: Diagnosis not present

## 2024-05-05 DIAGNOSIS — R92323 Mammographic fibroglandular density, bilateral breasts: Secondary | ICD-10-CM | POA: Diagnosis not present

## 2024-05-05 MED ORDER — WEGOVY 2.4 MG/0.75ML ~~LOC~~ SOAJ
2.4000 mg | SUBCUTANEOUS | 0 refills | Status: AC
  Filled 2024-05-05 – 2024-08-03 (×3): qty 3, 28d supply, fill #0

## 2024-05-08 ENCOUNTER — Other Ambulatory Visit (HOSPITAL_BASED_OUTPATIENT_CLINIC_OR_DEPARTMENT_OTHER): Payer: Self-pay

## 2024-05-09 ENCOUNTER — Other Ambulatory Visit: Payer: Self-pay

## 2024-05-09 ENCOUNTER — Other Ambulatory Visit (HOSPITAL_BASED_OUTPATIENT_CLINIC_OR_DEPARTMENT_OTHER): Payer: Self-pay

## 2024-05-09 ENCOUNTER — Encounter (HOSPITAL_BASED_OUTPATIENT_CLINIC_OR_DEPARTMENT_OTHER): Payer: Self-pay

## 2024-05-16 DIAGNOSIS — N924 Excessive bleeding in the premenopausal period: Secondary | ICD-10-CM | POA: Diagnosis not present

## 2024-05-16 DIAGNOSIS — D500 Iron deficiency anemia secondary to blood loss (chronic): Secondary | ICD-10-CM | POA: Diagnosis not present

## 2024-06-02 DIAGNOSIS — N924 Excessive bleeding in the premenopausal period: Secondary | ICD-10-CM | POA: Diagnosis not present

## 2024-06-02 DIAGNOSIS — Z9851 Tubal ligation status: Secondary | ICD-10-CM | POA: Diagnosis not present

## 2024-06-02 DIAGNOSIS — D500 Iron deficiency anemia secondary to blood loss (chronic): Secondary | ICD-10-CM | POA: Diagnosis not present

## 2024-06-02 DIAGNOSIS — N921 Excessive and frequent menstruation with irregular cycle: Secondary | ICD-10-CM | POA: Diagnosis not present

## 2024-06-02 DIAGNOSIS — Z6841 Body Mass Index (BMI) 40.0 and over, adult: Secondary | ICD-10-CM | POA: Diagnosis not present

## 2024-06-03 DIAGNOSIS — D500 Iron deficiency anemia secondary to blood loss (chronic): Secondary | ICD-10-CM | POA: Diagnosis not present

## 2024-06-12 DIAGNOSIS — M9915 Subluxation complex (vertebral) of pelvic region: Secondary | ICD-10-CM | POA: Diagnosis not present

## 2024-06-12 DIAGNOSIS — M9913 Subluxation complex (vertebral) of lumbar region: Secondary | ICD-10-CM | POA: Diagnosis not present

## 2024-06-12 DIAGNOSIS — M9914 Subluxation complex (vertebral) of sacral region: Secondary | ICD-10-CM | POA: Diagnosis not present

## 2024-08-03 ENCOUNTER — Other Ambulatory Visit: Payer: Self-pay

## 2024-08-04 ENCOUNTER — Other Ambulatory Visit (HOSPITAL_BASED_OUTPATIENT_CLINIC_OR_DEPARTMENT_OTHER): Payer: Self-pay

## 2024-08-09 ENCOUNTER — Other Ambulatory Visit (HOSPITAL_BASED_OUTPATIENT_CLINIC_OR_DEPARTMENT_OTHER): Payer: Self-pay

## 2024-08-15 ENCOUNTER — Other Ambulatory Visit (HOSPITAL_BASED_OUTPATIENT_CLINIC_OR_DEPARTMENT_OTHER): Payer: Self-pay
